# Patient Record
Sex: Female | Born: 1975
Health system: Southern US, Community
[De-identification: ages and names within clinical notes are randomized; demographics above are authoritative.]

## PROBLEM LIST (undated history)

## (undated) DIAGNOSIS — T7840XA Allergy, unspecified, initial encounter: Secondary | ICD-10-CM

## (undated) DIAGNOSIS — F329 Major depressive disorder, single episode, unspecified: Secondary | ICD-10-CM

## (undated) DIAGNOSIS — Z8619 Personal history of other infectious and parasitic diseases: Secondary | ICD-10-CM

## (undated) DIAGNOSIS — G43909 Migraine, unspecified, not intractable, without status migrainosus: Secondary | ICD-10-CM

## (undated) DIAGNOSIS — F32A Depression, unspecified: Secondary | ICD-10-CM

## (undated) HISTORY — DX: Personal history of other infectious and parasitic diseases: Z86.19

## (undated) HISTORY — DX: Migraine, unspecified, not intractable, without status migrainosus: G43.909

## (undated) HISTORY — PX: WISDOM TOOTH EXTRACTION: SHX21

## (undated) HISTORY — DX: Major depressive disorder, single episode, unspecified: F32.9

## (undated) HISTORY — DX: Depression, unspecified: F32.A

## (undated) HISTORY — DX: Allergy, unspecified, initial encounter: T78.40XA

---

## 2014-02-19 ENCOUNTER — Ambulatory Visit: Payer: Self-pay | Admitting: Family Medicine

## 2014-03-05 ENCOUNTER — Ambulatory Visit (INDEPENDENT_AMBULATORY_CARE_PROVIDER_SITE_OTHER): Payer: BC Managed Care – PPO | Admitting: Family Medicine

## 2014-03-05 ENCOUNTER — Encounter: Payer: Self-pay | Admitting: Family Medicine

## 2014-03-05 VITALS — BP 98/70 | HR 79 | Temp 98.6°F | Ht 65.25 in | Wt 171.3 lb

## 2014-03-05 DIAGNOSIS — F329 Major depressive disorder, single episode, unspecified: Secondary | ICD-10-CM

## 2014-03-05 DIAGNOSIS — J309 Allergic rhinitis, unspecified: Secondary | ICD-10-CM | POA: Insufficient documentation

## 2014-03-05 DIAGNOSIS — M25559 Pain in unspecified hip: Secondary | ICD-10-CM | POA: Insufficient documentation

## 2014-03-05 DIAGNOSIS — F32A Depression, unspecified: Secondary | ICD-10-CM

## 2014-03-05 DIAGNOSIS — M25551 Pain in right hip: Secondary | ICD-10-CM

## 2014-03-05 DIAGNOSIS — Z23 Encounter for immunization: Secondary | ICD-10-CM

## 2014-03-05 NOTE — Addendum Note (Signed)
Addended by: Johnella MoloneyFUNDERBURK, JO A on: 03/05/2014 12:14 PM   Modules accepted: Orders

## 2014-03-05 NOTE — Progress Notes (Signed)
HPI:  Jasmine Mueller is here to establish care.  Last PCP and physical: sees a gynecologist for the gyn physical.   Has the following chronic problems that require follow up and concerns today:  R hip pain: -occurred a few nights ago while lying on R hip -lasted a few minutes and then resolved, no pain since -denies nausea, vomiting, diarrhea, constipation, changes in bowels, weight loss  Seasonal allergies: -takes claritin intermittently -no hx of asthma or sob with this  Hx of Depression: -on and off -sees Dr. Jeraldine LootsKuar -medications: on wellbutrin and prozac, is getting counseling as well -denies: SI, thoughts of self harm, hx of hospitalization  Migraines: -takes ibuprofen for this -occur a few times per month for many years -started in highschool -FH of migraines in mother -unchanged  HM: -flu vaccine: opted to get this today -Tdap: 2013 per her report  ROS negative for unless reported above: fevers, unintentional weight loss, hearing or vision loss, chest pain, palpitations, struggling to breath, hemoptysis, melena, hematochezia, hematuria, falls, loc, si, thoughts of self harm  Past Medical History  Diagnosis Date  . Depression   . Allergy   . Migraines   . History of chicken pox     History reviewed. No pertinent past surgical history.  Family History  Problem Relation Age of Onset  . Migraines Mother   . Glaucoma Father   . Hypertension Maternal Grandmother     History   Social History  . Marital Status: Married    Spouse Name: N/A    Number of Children: N/A  . Years of Education: N/A   Social History Main Topics  . Smoking status: Former Games developermoker  . Smokeless tobacco: None     Comment: light, occ in the past  . Alcohol Use: 0.0 oz/week    0 Not specified per week     Comment: 1 drink a few times per month  . Drug Use: No  . Sexual Activity: Yes   Other Topics Concern  . 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  GENERAL: vitals reviewed and listed above, alert, oriented, appears well hydrated and in no acute distress  HEENT: atraumatic, conjunttiva clear, no obvious abnormalities on inspection of external nose and ears  NECK: no obvious masses on inspection  LUNGS: clear to auscultation bilaterally, no wheezes, rales or rhonchi, good air movement  CV: HRRR, no peripheral edema  ABD: BS+, soft, NTTP, no rebound or guarding  MS: moves all extremities without noticeable abnormality - normal ROM hip, neg compression test, neg FABER and FADIR, normal gait, no bony TTP, no TTP over troch bursa  PSYCH: pleasant and cooperative, no obvious depression or anxiety  ASSESSMENT AND PLAN:  Discussed  the following assessment and plan:  Hip pain, right -no findings on exam and symptom brief and completely resolved -observation and follow up if recurrent or other symptoms  Allergic rhinitis, unspecified allergic rhinitis type  Depression -managed by psych.  -We reviewed the PMH, PSH, FH, SH, Meds and Allergies. -We provided refills for any medications we will prescribe as needed. -We addressed current concerns per orders and patient instructions. -We have asked for records for pertinent exams, studies, vaccines and notes from previous providers. -We have advised patient to follow up per instructions below. -flu vaccine -offered  preventive visit and labs but she declined   -Patient advised to return or notify a doctor immediately if symptoms worsen or persist or new concerns arise.  Patient Instructions  BEFORE YOU LEAVE: -flu vaccine  Follow up as needed     Brayan Votaw, Dahlia ClientHANNAH R.

## 2014-03-05 NOTE — Patient Instructions (Addendum)
BEFORE YOU LEAVE: -flu vaccine  Follow up as needed

## 2014-03-05 NOTE — Progress Notes (Signed)
Pre visit review using our clinic review tool, if applicable. No additional management support is needed unless otherwise documented below in the visit note. 

## 2014-07-16 LAB — HM PAP SMEAR: HM Pap smear: NEGATIVE

## 2014-09-18 ENCOUNTER — Ambulatory Visit (INDEPENDENT_AMBULATORY_CARE_PROVIDER_SITE_OTHER): Payer: BLUE CROSS/BLUE SHIELD | Admitting: Family Medicine

## 2014-09-18 ENCOUNTER — Encounter: Payer: Self-pay | Admitting: Family Medicine

## 2014-09-18 VITALS — BP 102/80 | HR 84 | Temp 98.6°F | Ht 65.5 in | Wt 158.5 lb

## 2014-09-18 DIAGNOSIS — Z0000 Encounter for general adult medical examination without abnormal findings: Secondary | ICD-10-CM | POA: Diagnosis not present

## 2014-09-18 DIAGNOSIS — J309 Allergic rhinitis, unspecified: Secondary | ICD-10-CM

## 2014-09-18 DIAGNOSIS — F39 Unspecified mood [affective] disorder: Secondary | ICD-10-CM | POA: Insufficient documentation

## 2014-09-18 DIAGNOSIS — G43809 Other migraine, not intractable, without status migrainosus: Secondary | ICD-10-CM

## 2014-09-18 LAB — BASIC METABOLIC PANEL
BUN: 15 mg/dL (ref 6–23)
CO2: 25 mEq/L (ref 19–32)
Calcium: 9.5 mg/dL (ref 8.4–10.5)
Chloride: 101 mEq/L (ref 96–112)
Creatinine, Ser: 0.72 mg/dL (ref 0.40–1.20)
GFR: 96.73 mL/min (ref >60.00)
Glucose, Bld: 92 mg/dL (ref 70–99)
Potassium: 3.7 mEq/L (ref 3.5–5.1)
Sodium: 134 mEq/L — ABNORMAL LOW (ref 135–145)

## 2014-09-18 LAB — LIPID PANEL
Cholesterol: 202 mg/dL — ABNORMAL HIGH (ref 0–200)
HDL: 49.5 mg/dL (ref >39.00)
LDL Cholesterol: 119 mg/dL — ABNORMAL HIGH (ref 0–99)
NonHDL: 152.5
Total CHOL/HDL Ratio: 4
Triglycerides: 167 mg/dL — ABNORMAL HIGH (ref 0.0–149.0)
VLDL: 33.4 mg/dL (ref 0.0–40.0)

## 2014-09-18 NOTE — Progress Notes (Signed)
HPI:  Here for CPE:  -Concerns and/or follow up today:  Has form to complete for work  Seasonal allergies: -takes claritin intermittently -no hx of asthma or sob with this  Hx of Depression: -on and off -sees Dr. Jeraldine Loots -medications: on wellbutrin and prozac, is getting counseling as well -denies: SI, thoughts of self harm, hx of hospitalization  Migraines: -takes ibuprofen for this -occur a few times per month for many years -started in highschool -FH of migraines in mother -unchanged  -Diet: variety of foods, balance and well rounded  -Exercise: no regular exercise - a little elliptical 3 days per week  -Taking folic acid, vitamin D or calcium: no  -Diabetes and Dyslipidemia Screening: doing today - FASTING  -Hx of HTN: no  -Vaccines: UTD  -pap history: sees gyn  -FDLMP: normal, regular  -sexual activity: yes, female partner, no new partners  -wants STI testing (Hep C if born [birth year]): no  -FH breast, colon or ovarian ca: see FH Last mammogram: sees gyn Last colon cancer screening:  Breast Ca Risk Assessment: -sees gyn  -Alcohol, Tobacco, drug use: see social history  Review of Systems - no fevers, unintentional weight loss, vision loss, hearing loss, chest pain, sob, hemoptysis, melena, hematochezia, hematuria, genital discharge, changing or concerning skin lesions, bleeding, bruising, loc, thoughts of self harm or SI  Past Medical History  Diagnosis Date  . Depression   . Allergy   . Migraines   . History of chicken pox     No past surgical history on file.  Family History  Problem Relation Age of Onset  . Migraines Mother   . Glaucoma Father   . Hypertension Maternal Grandmother     History   Social History  . Marital Status: Married    Spouse Name: N/A  . Number of Children: N/A  . Years of Education: N/A   Social History Main Topics  . Smoking status: Former Games developer  . Smokeless tobacco: Not on file     Comment: light, occ in the  past  . Alcohol Use: 0.0 oz/week    0 Standard drinks or equivalent per week     Comment: 1 drink a few times per month  . Drug Use: No  . Sexual Activity: Yes   Other Topics Concern  . 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  buPROPion (WELLBUTRIN XL) 150 MG 24 hr tablet, Take 150 mg by mouth daily., Disp: , Rfl:  .  FLUoxetine (PROZAC) 20 MG tablet, Take 20 mg by mouth daily., Disp: , Rfl:  .  Prenatal Vit-Fe Fumarate-FA (PRENATAL VITAMIN PO), Take by mouth daily., Disp: , Rfl:   EXAM:  Filed Vitals:   09/18/14 1124  BP: 102/80  Pulse: 84  Temp: 98.6 F (37 C)    GENERAL: vitals reviewed and listed below, alert, oriented, appears well hydrated and in no acute distress  HEENT: head atraumatic, PERRLA, normal appearance of eyes, ears, nose and mouth. moist mucus membranes.  NECK: supple, no masses or lymphadenopathy  LUNGS: clear to auscultation bilaterally, no rales, rhonchi or wheeze  CV: HRRR, no peripheral edema or cyanosis, normal pedal pulses  BREAST: declined  ABDOMEN: bowel sounds normal, soft, non tender to palpation, no masses, no rebound or guarding  GU: declined  RECTAL: refused  SKIN: no rash or abnormal lesions  MS: normal gait, moves all extremities normally  NEURO: CN II-XII grossly intact, normal muscle strength and sensation to light touch on extremities  PSYCH: normal affect, pleasant and cooperative  ASSESSMENT AND PLAN:  Discussed the following assessment and plan:  1.Visit for preventive health examination - Lipid panel - Basic metabolic panel -Discussed and advised all [place] preventive services health task force level A and B recommendations for age, sex and risks. -Advised at least 150 minutes of exercise per week  and a healthy diet low in saturated fats and sweets and consisting of fresh fruits and vegetables, lean meats such as fish and white chicken and whole grains. -does gyn and breast health with her gynecologist -FASTING labs, studies and vaccines per orders this encounter -form given to assistant to complete and fax once lab results return  2. Allergic rhinitis, unspecified allergic rhinitis type -stable, cont current tx  3. Depression -cont treatment with Dr. Evelene Croon  4. Other migraine without status migrainosus, not intractable -stable     Orders Placed This Encounter  Procedures  . Lipid panel  . Basic metabolic panel    Patient advised to return to clinic immediately if symptoms worsen or persist or new concerns.  Patient Instructions    -Vit D3 [phone] IU daily and folic acid if chance of pregnancy    No Follow-up on file.  Kriste Basque R.

## 2014-09-18 NOTE — Patient Instructions (Addendum)
BEFORE YOU LEAVE: -labs -check waist circumference -schedule follow up in 1 year for a physical exam  -We have ordered labs or studies at this visit. It can take up to 1-2 weeks for results and processing. We will contact you with instructions IF your results are abnormal. Normal results will be released to your [facility]. If you have not heard from [place] or can not find your results in [facility] in 2 weeks please contact our office.  -Vit D3 [phone] IU daily and folic acid if chance of pregnancy  We recommend the following healthy lifestyle measures: - eat a healthy diet consisting of lots of vegetables, fruits, beans, nuts, seeds, healthy meats such as white chicken and fish and whole grains.  - avoid fried foods, fast food, processed foods, sodas, red meet and other fattening foods.  - get a least 150 minutes of aerobic exercise per week.

## 2014-09-18 NOTE — Progress Notes (Signed)
Pre visit review using our clinic review tool, if applicable. No additional management support is needed unless otherwise documented below in the visit note. 

## 2016-02-01 ENCOUNTER — Ambulatory Visit (INDEPENDENT_AMBULATORY_CARE_PROVIDER_SITE_OTHER): Payer: BLUE CROSS/BLUE SHIELD | Admitting: Family Medicine

## 2016-02-01 ENCOUNTER — Encounter: Payer: Self-pay | Admitting: Family Medicine

## 2016-02-01 VITALS — BP 90/68 | HR 76 | Temp 98.2°F | Ht 65.75 in | Wt 165.8 lb

## 2016-02-01 DIAGNOSIS — E785 Hyperlipidemia, unspecified: Secondary | ICD-10-CM

## 2016-02-01 DIAGNOSIS — R739 Hyperglycemia, unspecified: Secondary | ICD-10-CM

## 2016-02-01 DIAGNOSIS — Z6826 Body mass index (BMI) 26.0-26.9, adult: Secondary | ICD-10-CM

## 2016-02-01 DIAGNOSIS — Z23 Encounter for immunization: Secondary | ICD-10-CM

## 2016-02-01 DIAGNOSIS — Z0000 Encounter for general adult medical examination without abnormal findings: Secondary | ICD-10-CM | POA: Diagnosis not present

## 2016-02-01 LAB — LIPID PANEL
Cholesterol: 225 mg/dL — ABNORMAL HIGH (ref 0–200)
HDL: 57.2 mg/dL (ref >39.00)
LDL Cholesterol: 132 mg/dL — ABNORMAL HIGH (ref 0–99)
NonHDL: 167.63
Total CHOL/HDL Ratio: 4
Triglycerides: 176 mg/dL — ABNORMAL HIGH (ref 0.0–149.0)
VLDL: 35.2 mg/dL (ref 0.0–40.0)

## 2016-02-01 LAB — BASIC METABOLIC PANEL
BUN: 13 mg/dL (ref 6–23)
CO2: 26 mEq/L (ref 19–32)
Calcium: 9.3 mg/dL (ref 8.4–10.5)
Chloride: 101 mEq/L (ref 96–112)
Creatinine, Ser: 0.67 mg/dL (ref 0.40–1.20)
GFR: 104.34 mL/min (ref >60.00)
Glucose, Bld: 108 mg/dL — ABNORMAL HIGH (ref 70–99)
Potassium: 3.6 mEq/L (ref 3.5–5.1)
Sodium: 137 mEq/L (ref 135–145)

## 2016-02-01 NOTE — Progress Notes (Signed)
HPI:  Here for CPE:  -Concerns and/or follow up today: Requests that I am metrics screening form from work because completed. Does need a fasting blood glucose and lipid panel for completion of this form.  Seasonal allergies: -takes claritin intermittently -no hx of asthma or sob with this  Hx of Depression: -on and off -sees Dr. Jeraldine LootsKuar -medications: No longer on any medications and is doing well - continues to follow with pcych -denies: SI, thoughts of self harm, hx of hospitalization  Migraines: -Stable -takes ibuprofen for this -occur a few times per month for many years -started in highschool -FH of migraines in mother -unchanged  -Diet: variety of foods, balance and well rounded, larger portion sizes  -Exercise: no regular exercise  -Taking folic acid, vitamin D or calcium: no  -Diabetes and Dyslipidemia Screening: FASTING for labs  -Hx of HTN: no  -Vaccines: UTD except for flu shot which she plans to do today  -pap history: sees Dr. Mora ApplPinn, reports pap exam yearly   -wants STI testing (Hep C if born [birth year]): no  Breast Ca Risk Assessment: -Sees Dr. Mora ApplPinn for breast health and assessment  -Alcohol, Tobacco, drug use: see social history  Review of Systems - no fevers, unintentional weight loss, vision loss, hearing loss, chest pain, sob, hemoptysis, melena, hematochezia, hematuria, genital discharge, changing or concerning skin lesions, bleeding, bruising, loc, thoughts of self harm or SI  Past Medical History:  Diagnosis Date  . Allergy   . Depression   . History of chicken pox   . Migraines     No past surgical history on file.  Family History  Problem Relation Age of Onset  . Migraines Mother   . Glaucoma Father   . Hypertension Maternal Grandmother     Social History   Social History  . Marital status: Married    Spouse name: N/A  . Number of children: N/A  . Years of education: N/A   Social History Main Topics  . Smoking status: Former  Games developermoker  . Smokeless tobacco: None     Comment: light, occ in the past  . Alcohol use 0.0 oz/week     Comment: 1 drink a few times per month  . Drug use: No  . Sexual activity: Yes   Other Topics Concern  . 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  EXAM:  Vitals:   02/01/16 1137  BP: 90/68  Pulse: 76  Temp: 98.2 F (36.8 C)    GENERAL: vitals reviewed and listed below, alert, oriented, appears well hydrated and in no acute distress  HEENT: head atraumatic, PERRLA, normal appearance of eyes, ears, nose and mouth. moist mucus membranes.  NECK: supple, no masses or lymphadenopathy  LUNGS: clear to auscultation bilaterally, no rales, rhonchi or wheeze  CV: HRRR, no peripheral edema or cyanosis, normal pedal pulses  BREAST: declined, does with gyn  ABDOMEN: bowel sounds normal, soft, non tender to palpation, no masses, no rebound or guarding  GU: declined, does with gyn  SKIN: no rash or abnormal lesions  MS: normal gait, moves all extremities normally  NEURO: normal gait, speech and thought processing grossly intact, muscle tone grossly  intact throughout  PSYCH: normal affect, pleasant and cooperative  ASSESSMENT AND PLAN:  Discussed the following assessment and plan:  Encounter for preventive health examination - Plan: Lipid Panel, Basic metabolic panel  BMI 26.0-26.9,adult  Encounter for immunization - Plan: Flu Vaccine QUAD 36+ mos IM  -Discussed and advised all [place] preventive services health task force level A and B recommendations for age, sex and risks. -Advised at least 150 minutes of exercise per week and a healthy diet  -FASTING labs, studies and vaccines per orders this encounter  Orders Placed This Encounter  Procedures  .  Flu Vaccine QUAD 36+ mos IM  . Lipid Panel  . Basic metabolic panel    Patient advised to return to clinic immediately if symptoms worsen or persist or new concerns.  Patient Instructions  BEFORE YOU LEAVE: -flu shot -pap report from Dr. Mora ApplPinn -follow up: yearly and as needed -labs  Vit D3 [phone] IU daily  We have ordered labs or studies at this visit. It can take up to 1-2 weeks for results and processing. IF results require follow up or explanation, we will call you with instructions. Clinically stable results will be released to your [facility]. If you have not heard from us or cannot find your results in [facility] in 2 weeks please contact our office at [phone].  If you are not yet signed up for [facility], please consider signing up.  We recommend the following healthy lifestyle for LIFE: 1) Small portions.   Tip: eat off of a salad plate instead of a dinner plate.  Tip: if you need more or a snack choose fruits, veggies and/or a handful of nuts or seeds.  2) Eat a healthy clean diet.  * Tip: Avoid (less then 1 serving per week): processed foods, sweets, sweetened drinks, white starches (rice, flour, bread, potatoes, pasta, etc), red meat, fast foods, butter  *Tip: CHOOSE instead   * 5-9 servings per day of fresh or frozen fruits and vegetables (but not corn, potatoes, bananas, canned or dried fruit)   *nuts and seeds, beans   *olives and olive oil   *small portions of lean meats such as fish and white chicken    *small portions of whole grains  3)Get at least 150 minutes of sweaty aerobic exercise per week.  4)Reduce stress - consider counseling, meditation and relaxation to balance other aspects of your life.     No Follow-up on file.  Kriste BasqueKIM, HANNAH R., DO

## 2016-02-01 NOTE — Progress Notes (Signed)
Pre visit review using our clinic review tool, if applicable. No additional management support is needed unless otherwise documented below in the visit note. 

## 2016-02-01 NOTE — Patient Instructions (Signed)
BEFORE YOU LEAVE: -flu shot -pap report from Dr. Mora ApplPinn -follow up: yearly and as needed -labs  Vit D3 [phone] IU daily  We have ordered labs or studies at this visit. It can take up to 1-2 weeks for results and processing. IF results require follow up or explanation, we will call you with instructions. Clinically stable results will be released to your [facility]. If you have not heard from us or cannot find your results in [facility] in 2 weeks please contact our office at [phone].  If you are not yet signed up for [facility], please consider signing up.  We recommend the following healthy lifestyle for LIFE: 1) Small portions.   Tip: eat off of a salad plate instead of a dinner plate.  Tip: if you need more or a snack choose fruits, veggies and/or a handful of nuts or seeds.  2) Eat a healthy clean diet.  * Tip: Avoid (less then 1 serving per week): processed foods, sweets, sweetened drinks, white starches (rice, flour, bread, potatoes, pasta, etc), red meat, fast foods, butter  *Tip: CHOOSE instead   * 5-9 servings per day of fresh or frozen fruits and vegetables (but not corn, potatoes, bananas, canned or dried fruit)   *nuts and seeds, beans   *olives and olive oil   *small portions of lean meats such as fish and white chicken    *small portions of whole grains  3)Get at least 150 minutes of sweaty aerobic exercise per week.  4)Reduce stress - consider counseling, meditation and relaxation to balance other aspects of your life.

## 2016-02-03 ENCOUNTER — Encounter: Payer: Self-pay | Admitting: Family Medicine

## 2016-02-10 NOTE — Addendum Note (Signed)
Addended by: Johnella MoloneyFUNDERBURK, JO A on: 02/10/2016 10:18 AM   Modules accepted: Orders

## 2016-08-14 ENCOUNTER — Other Ambulatory Visit: Payer: BLUE CROSS/BLUE SHIELD

## 2016-08-15 NOTE — Progress Notes (Deleted)
  HPI:  Jasmine Mueller is a pleasant 41 y.o. here for follow up. Chronic medical problems summarized below were reviewed for changes and stability and were updated as needed below. These issues and their treatment remain stable for the most part. ***. Denies CP, SOB, DOE, treatment intolerance or new symptoms.  Hyperlipidemia/Hyperglycemia: -on labs last visit, lifestlye recs advised   Seasonal allergies: -takes claritin intermittently -no hx of asthma or sob with this  Hx of Depression: -on and off -sees Dr. Jeraldine LootsKuar -medications: No longer on any medications and is doing well - continues to follow with pcych -denies: SI, thoughts of self harm, hx of hospitalization  Migraines: -Stable -takes ibuprofen for this -occur a few times per month for many years -started in highschool -FH of migraines in mother -unchanged   ROS: See pertinent positives and negatives per HPI.  Past Medical History:  Diagnosis Date  . Allergy   . Depression   . History of chicken pox   . Migraines     No past surgical history on file.  Family History  Problem Relation Age of Onset  . Migraines Mother   . Glaucoma Father   . Hypertension Maternal Grandmother     Social History   Social History  . Marital status: Married    Spouse name: N/A  . Number of children: N/A  . Years of education: N/A   Social History Main Topics  . Smoking status: Former Games developermoker  . Smokeless tobacco: Not on file     Comment: light, occ in the past  . Alcohol use 0.0 oz/week     Comment: 1 drink a few times per month  . Drug use: No  . Sexual activity: Yes   Other Topics Concern  . 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  EXAM:  There were no vitals filed for this  visit.  There is no height or weight on file to calculate BMI.  GENERAL: vitals reviewed and listed above, alert, oriented, appears well hydrated and in no acute distress  HEENT: atraumatic, conjunttiva clear, no obvious abnormalities on inspection of external nose and ears  NECK: no obvious masses on inspection  LUNGS: clear to auscultation bilaterally, no wheezes, rales or rhonchi, good air movement  CV: HRRR, no peripheral edema  MS: moves all extremities without noticeable abnormality  PSYCH: pleasant and cooperative, no obvious depression or anxiety  ASSESSMENT AND PLAN:  Discussed the following assessment and plan:  No diagnosis found.  -Patient advised to return or notify a doctor immediately if symptoms worsen or persist or new concerns arise.  There are no Patient Instructions on file for this visit.  Kriste BasqueKIM, HANNAH R., DO

## 2016-08-17 ENCOUNTER — Ambulatory Visit: Payer: BLUE CROSS/BLUE SHIELD | Admitting: Family Medicine

## 2016-08-17 ENCOUNTER — Telehealth: Payer: Self-pay | Admitting: *Deleted

## 2016-08-17 DIAGNOSIS — Z0289 Encounter for other administrative examinations: Secondary | ICD-10-CM

## 2016-08-17 NOTE — Telephone Encounter (Signed)
Patient was a no-show for today's appt.  I called the pt to check on her and she stated she thought she hit a button to cancel the appt when she received a reminder call.  Stated she will call back for a follow up visit.

## 2016-12-21 ENCOUNTER — Encounter: Payer: Self-pay | Admitting: Family Medicine

## 2017-02-02 ENCOUNTER — Encounter: Payer: BLUE CROSS/BLUE SHIELD | Admitting: Family Medicine

## 2017-02-25 NOTE — Progress Notes (Signed)
HPI:  Here for CPE: Due for flu shot and labs.   -Concerns and/or follow up today:  Hx of hyperlipidemia, hyperglycemia, depression, migraines and seasonal allergies. She was advised to make lifestyle changes for hyperglycemia and hyperlipidemia and follow up for recheck after last CPE. She did not return until now.  Reports she is doing well.  She is working with a nutritionist and reports she has been eating healthier and getting regular exercise.  She has a form from work for completion, a biometric screening form that requires fasting lab work.  Used to see Dr. Toy Care in pscyhiatry. Off medications and doing well last year.  -Diet: variety of foods, balance and well rounded, larger portion sizes -Exercise: no regular exercise -Taking folic acid, vitamin D or calcium: no -Diabetes and Dyslipidemia Screening: Due for labs -Vaccines: see vaccine section EPIC -pap history: saw Dr. Alwyn Pea in gyn in the past, last pap 07/2014 in epic, reports HAVE been normal and she would like to do her next Pap next year with [place] if possible -FDLMP: see nursing notes -sexual activity: yes, female partner, no new partners -wants STI testing (Hep C if born 90-65): no -FH breast, colon or ovarian ca: see FH Last mammogram: n/a Last colon cancer screening: n/a Breast Ca Risk Assessment: see family history and pt history DEXA (>/= 65): Not applicable  -Alcohol, Tobacco, drug use: see social history  Review of Systems - no fevers, unintentional weight loss, vision loss, hearing loss, chest pain, sob, hemoptysis, melena, hematochezia, hematuria, genital discharge, changing or concerning skin lesions, bleeding, bruising, loc, thoughts of self harm or SI  Past Medical History:  Diagnosis Date  . Allergy   . Depression   . History of chicken pox   . Migraines     History reviewed. No pertinent surgical history.  Family History  Problem Relation Age of Onset  . Migraines Mother   . Glaucoma Father   .  Hypertension Maternal Grandmother     Social History   Socioeconomic History  . Marital status: Married    Spouse name: None  . Number of children: None  . Years of education: None  . Highest education level: None  Social Needs  . Financial resource strain: None  . Food insecurity - worry: None  . Food insecurity - inability: None  . Transportation needs - medical: None  . Transportation needs - non-medical: None  Occupational History  . None  Tobacco Use  . Smoking status: Former Research scientist (life sciences)  . Smokeless tobacco: Never Used  . Tobacco comment: light, occ in the past  Substance and Sexual Activity  . Alcohol use: Yes    Alcohol/week: 0.0 oz    Comment: 1 drink a few times per month  . Drug use: No  . Sexual activity: Yes  Other Topics Concern  . 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  EXAM:  Vitals:   02/26/17 1313  BP: 102/80  Pulse: 81  Temp: 99.1 F (37.3 C)   Body mass index is 29.58 kg/m. Visit diagnosis gENERAL: vitals reviewed and listed below, alert, oriented, appears well hydrated and in no acute distress  HEENT: head atraumatic, PERRLA, normal appearance of  eyes, ears, nose and mouth. moist mucus membranes.  NECK: supple, no masses or lymphadenopathy  LUNGS: clear to auscultation bilaterally, no rales, rhonchi or wheeze  CV: HRRR, no peripheral edema or cyanosis, normal pedal pulses  ABDOMEN: bowel sounds normal, soft, non tender to palpation, no masses, no rebound or guarding  GU/BREAST: Declined, she agrees to self breast exams and to do a GYN visit here next year when she needs her Pap smear t  SKIN: no rash or abnormal lesions  MS: normal gait, moves all extremities normally  NEURO: normal gait, speech and thought processing  grossly intact, muscle tone grossly intact throughout  PSYCH: normal affect, pleasant and cooperative  ASSESSMENT AND PLAN:  Discussed the following assessment and plan:  PREVENTIVE EXAM: -Discussed and advised all [place] preventive services health task force level A and B recommendations for age, sex and risks. -Advised at least 150 minutes of exercise per week and a healthy diet with avoidance of (less then 1 serving per week) processed foods, white starches, red meat, fast foods and sweets and consisting of: * 5-9 servings of fresh fruits and vegetables (not corn or potatoes) *nuts and seeds, beans *olives and olive oil *lean meats such as fish and white chicken  *whole grains -labs, studies and vaccines per orders this encounter     Patient advised to return to clinic immediately if symptoms worsen or persist or new concerns.  Patient Instructions  BEFORE YOU LEAVE: -Flu shot  -Schedule fasting lab visit in the next 1-2 weeks -follow up: cpe w/ pap in 1 year  We have ordered labs or studies at this visit. It can take up to 1-2 weeks for results and processing. IF results require follow up or explanation, we will call you with instructions. Clinically stable results will be released to your [facility]. If you have not heard from [place] or cannot find your results in [facility] in 2 weeks please contact our office at [phone].  If you are not yet signed up for [facility], please consider signing up.  We recommend the following healthy lifestyle for LIFE: 1) Small portions. But, make sure to get regular (at least 3 per day), healthy meals and small healthy snacks if needed.  2) Eat a healthy clean diet.   TRY TO EAT: -at least 5-7 servings of low sugar, colorful, and nutrient rich vegetables per day (not corn, potatoes or bananas.) -berries are the best choice if you wish to eat fruit (only eat small amounts if trying to reduce weight)  -lean meets (fish, white meat of chicken or  [place]) -vegan proteins for some meals - beans or tofu, whole grains, nuts and seeds -Replace bad fats with good fats - good fats include: fish, nuts and seeds, canola oil, olive oil -small amounts of low fat or non fat dairy -small amounts of100 % whole grains - check the lables -drink plenty of water  AVOID: -SUGAR, sweets, anything with added sugar, corn syrup or sweeteners - must read labels as even foods advertised as "healthy" often are loaded with sugar -if you must have a sweetener, small amounts of stevia may be best -sweetened beverages and artificially sweetened beverages -simple starches (rice, bread, potatoes, pasta, chips, etc - small amounts of 100% whole grains are ok) -red meat, pork, butter -fried foods, fast food, processed food, excessive dairy, eggs and coconut.  3)Get at least 150 minutes of sweaty aerobic exercise per week.  4)Reduce stress - consider counseling, meditation and relaxation to balance other  aspects of your life.  Health Maintenance, Female Adopting a healthy lifestyle and getting preventive care can go a long way to promote health and wellness. Talk with your health care provider about what schedule of regular examinations is right for you. This is a good chance for you to check in with your provider about disease prevention and staying healthy. In between checkups, there are plenty of things you can do on your own. Experts have done a lot of research about which lifestyle changes and preventive measures are most likely to keep you healthy. Ask your health care provider for more information. Weight and diet Eat a healthy diet  Be sure to include plenty of vegetables, fruits, low-fat dairy products, and lean protein.  Do not eat a lot of foods high in solid fats, added sugars, or salt.  Get regular exercise. This is one of the most important things you can do for your health. ? Most adults should exercise for at least 150 minutes each week. The  exercise should increase your heart rate and make you sweat (moderate-intensity exercise). ? Most adults should also do strengthening exercises at least twice a week. This is in addition to the moderate-intensity exercise.  Maintain a healthy weight  Body mass index (BMI) is a measurement that can be used to identify possible weight problems. It estimates body fat based on height and weight. Your health care provider can help determine your BMI and help you achieve or maintain a healthy weight.  For females 58 years of age and older: ? A BMI below 18.5 is considered underweight. ? A BMI of 18.5 to 24.9 is normal. ? A BMI of 25 to 29.9 is considered overweight. ? A BMI of 30 and above is considered obese.  Watch levels of cholesterol and blood lipids  You should start having your blood tested for lipids and cholesterol at 39 years of age, then have this test every 5 years.  You may need to have your cholesterol levels checked more often if: ? Your lipid or cholesterol levels are high. ? You are older than 39 years of age. ? You are at high risk for heart disease.  Cancer screening Lung Cancer  Lung cancer screening is recommended for adults 44-25 years old who are at high risk for lung cancer because of a history of smoking.  A yearly low-dose CT scan of the lungs is recommended for people who: ? Currently smoke. ? Have quit within the past 15 years. ? Have at least a 30-pack-year history of smoking. A pack year is smoking an average of one pack of cigarettes a day for 1 year.  Yearly screening should continue until it has been 15 years since you quit.  Yearly screening should stop if you develop a health problem that would prevent you from having lung cancer treatment.  Breast Cancer  Practice breast self-awareness. This means understanding how your breasts normally appear and feel.  It also means doing regular breast self-exams. Let your health care provider know about any  changes, no matter how small.  If you are in your 20s or 30s, you should have a clinical breast exam (CBE) by a health care provider every 1-3 years as part of a regular health exam.  If you are 58 or older, have a CBE every year. Also consider having a breast X-ray (mammogram) every year.  If you have a family history of breast cancer, talk to your health care provider about genetic screening.  If you are at high risk for breast cancer, talk to your health care provider about having an MRI and a mammogram every year.  Breast cancer gene (BRCA) assessment is recommended for women who have family members with BRCA-related cancers. BRCA-related cancers include: ? Breast. ? Ovarian. ? Tubal. ? Peritoneal cancers.  Results of the assessment will determine the need for genetic counseling and BRCA1 and BRCA2 testing.  Cervical Cancer Your health care provider may recommend that you be screened regularly for cancer of the pelvic organs (ovaries, uterus, and vagina). This screening involves a pelvic examination, including checking for microscopic changes to the surface of your cervix (Pap test). You may be encouraged to have this screening done every 3 years, beginning at age 37.  For women ages 84-65, health care providers may recommend pelvic exams and Pap testing every 3 years, or they may recommend the Pap and pelvic exam, combined with testing for human papilloma virus (HPV), every 5 years. Some types of HPV increase your risk of cervical cancer. Testing for HPV may also be done on women of any age with unclear Pap test results.  Other health care providers may not recommend any screening for nonpregnant women who are considered low risk for pelvic cancer and who do not have symptoms. Ask your health care provider if a screening pelvic exam is right for you.  If you have had past treatment for cervical cancer or a condition that could lead to cancer, you need Pap tests and screening for cancer  for at least 20 years after your treatment. If Pap tests have been discontinued, your risk factors (such as having a new sexual partner) need to be reassessed to determine if screening should resume. Some women have medical problems that increase the chance of getting cervical cancer. In these cases, your health care provider may recommend more frequent screening and Pap tests.  Colorectal Cancer  This type of cancer can be detected and often prevented.  Routine colorectal cancer screening usually begins at 39 years of age and continues through 39 years of age.  Your health care provider may recommend screening at an earlier age if you have risk factors for colon cancer.  Your health care provider may also recommend using home test kits to check for hidden blood in the stool.  A small camera at the end of a tube can be used to examine your colon directly (sigmoidoscopy or colonoscopy). This is done to check for the earliest forms of colorectal cancer.  Routine screening usually begins at age 66.  Direct examination of the colon should be repeated every 5-10 years through 39 years of age. However, you may need to be screened more often if early forms of precancerous polyps or small growths are found.  Skin Cancer  Check your skin from head to toe regularly.  Tell your health care provider about any new moles or changes in moles, especially if there is a change in a mole's shape or color.  Also tell your health care provider if you have a mole that is larger than the size of a pencil eraser.  Always use sunscreen. Apply sunscreen liberally and repeatedly throughout the day.  Protect yourself by wearing long sleeves, pants, a wide-brimmed hat, and sunglasses whenever you are outside.  Heart disease, diabetes, and high blood pressure  High blood pressure causes heart disease and increases the risk of stroke. High blood pressure is more likely to develop in: ? People who have blood  pressure in the high end of the normal range (130-139/85-89 mm Hg). ? People who are overweight or obese. ? People who are African American.  If you are 75-36 years of age, have your blood pressure checked every 3-5 years. If you are 25 years of age or older, have your blood pressure checked every year. You should have your blood pressure measured twice-once when you are at a hospital or clinic, and once when you are not at a hospital or clinic. Record the average of the two measurements. To check your blood pressure when you are not at a hospital or clinic, you can use: ? An automated blood pressure machine at a pharmacy. ? A home blood pressure monitor.  If you are between 56 years and 54 years old, ask your health care provider if you should take aspirin to prevent strokes.  Have regular diabetes screenings. This involves taking a blood sample to check your fasting blood sugar level. ? If you are at a normal weight and have a low risk for diabetes, have this test once every three years after 39 years of age. ? If you are overweight and have a high risk for diabetes, consider being tested at a younger age or more often. Preventing infection Hepatitis B  If you have a higher risk for hepatitis B, you should be screened for this virus. You are considered at high risk for hepatitis B if: ? You were born in a country where hepatitis B is common. Ask your health care provider which countries are considered high risk. ? Your parents were born in a high-risk country, and you have not been immunized against hepatitis B (hepatitis B vaccine). ? You have HIV or AIDS. ? You use needles to inject street drugs. ? You live with someone who has hepatitis B. ? You have had sex with someone who has hepatitis B. ? You get hemodialysis treatment. ? You take certain medicines for conditions, including cancer, organ transplantation, and autoimmune conditions.  Hepatitis C  Blood testing is recommended  for: ? Everyone born from [birth year] through 1963. ? Anyone with known risk factors for hepatitis C.  Sexually transmitted infections (STIs)  You should be screened for sexually transmitted infections (STIs) including gonorrhea and chlamydia if: ? You are sexually active and are younger than 39 years of age. ? You are older than 39 years of age and your health care provider tells you that you are at risk for this type of infection. ? Your sexual activity has changed since you were last screened and you are at an increased risk for chlamydia or gonorrhea. Ask your health care provider if you are at risk.  If you do not have HIV, but are at risk, it may be recommended that you take a prescription medicine daily to prevent HIV infection. This is called pre-exposure prophylaxis (PrEP). You are considered at risk if: ? You are sexually active and do not regularly use condoms or know the HIV status of your partner(s). ? You take drugs by injection. ? You are sexually active with a partner who has HIV.  Talk with your health care provider about whether you are at high risk of being infected with HIV. If you choose to begin PrEP, you should first be tested for HIV. You should then be tested every 3 months for as long as you are taking PrEP. Pregnancy  If you are premenopausal and you may become pregnant, ask your health care provider about preconception counseling.  If you may become pregnant, take 400 to 800 micrograms (mcg) of folic acid every day.  If you want to prevent pregnancy, talk to your health care provider about birth control (contraception). Osteoporosis and menopause  Osteoporosis is a disease in which the bones lose minerals and strength with aging. This can result in serious bone fractures. Your risk for osteoporosis can be identified using a bone density scan.  If you are 31 years of age or older, or if you are at risk for osteoporosis and fractures, ask your health care provider if  you should be screened.  Ask your health care provider whether you should take a calcium or vitamin D supplement to lower your risk for osteoporosis.  Menopause may have certain physical symptoms and risks.  Hormone replacement therapy may reduce some of these symptoms and risks. Talk to your health care provider about whether hormone replacement therapy is right for you. Follow these instructions at home:  Schedule regular health, dental, and eye exams.  Stay current with your immunizations.  Do not use any tobacco products including cigarettes, chewing tobacco, or electronic cigarettes.  If you are pregnant, do not drink alcohol.  If you are breastfeeding, limit how much and how often you drink alcohol.  Limit alcohol intake to no more than 1 drink per day for nonpregnant women. One drink equals 12 ounces of beer, 5 ounces of wine, or 1 ounces of hard liquor.  Do not use street drugs.  Do not share needles.  Ask your health care provider for help if you need support or information about quitting drugs.  Tell your health care provider if you often feel depressed.  Tell your health care provider if you have ever been abused or do not feel safe at home. This information is not intended to replace advice given to you by your health care provider. Make sure you discuss any questions you have with your health care provider. Document Released: 10/03/2010 Document Revised: 08/26/2015 Document Reviewed: 12/22/2014 Elsevier Interactive Patient Education  2018 [employer].    No Follow-up on file.  Colin Benton R., DO

## 2017-02-26 ENCOUNTER — Ambulatory Visit (INDEPENDENT_AMBULATORY_CARE_PROVIDER_SITE_OTHER): Payer: BLUE CROSS/BLUE SHIELD | Admitting: Family Medicine

## 2017-02-26 ENCOUNTER — Encounter: Payer: Self-pay | Admitting: Family Medicine

## 2017-02-26 VITALS — BP 102/80 | HR 81 | Temp 99.1°F | Ht 65.25 in | Wt 179.1 lb

## 2017-02-26 DIAGNOSIS — Z0000 Encounter for general adult medical examination without abnormal findings: Secondary | ICD-10-CM

## 2017-02-26 DIAGNOSIS — R739 Hyperglycemia, unspecified: Secondary | ICD-10-CM

## 2017-02-26 DIAGNOSIS — Z6829 Body mass index (BMI) 29.0-29.9, adult: Secondary | ICD-10-CM

## 2017-02-26 DIAGNOSIS — E785 Hyperlipidemia, unspecified: Secondary | ICD-10-CM

## 2017-02-26 DIAGNOSIS — Z23 Encounter for immunization: Secondary | ICD-10-CM | POA: Diagnosis not present

## 2017-02-26 NOTE — Patient Instructions (Signed)
BEFORE YOU LEAVE: -Flu shot  -Schedule fasting lab visit in the next 1-2 weeks -follow up: cpe w/ pap in 1 year  We have ordered labs or studies at this visit. It can take up to 1-2 weeks for results and processing. IF results require follow up or explanation, we will call you with instructions. Clinically stable results will be released to your [facility]. If you have not heard from [place] or cannot find your results in [facility] in 2 weeks please contact our office at [phone].  If you are not yet signed up for [facility] please consider signing up.  We recommend the following healthy lifestyle for LIFE: 1) Small portions. But, make sure to get regular (at least 3 per day), healthy meals and small healthy snacks if needed.  2) Eat a healthy clean diet.   TRY TO EAT: -at least 5-7 servings of low sugar, colorful, and nutrient rich vegetables per day (not corn, potatoes or bananas.) -berries are the best choice if you wish to eat fruit (only eat small amounts if trying to reduce weight)  -lean meets (fish, white meat of chicken or [place]) -vegan proteins for some meals - beans or tofu, whole grains, nuts and seeds -Replace bad fats with good fats - good fats include: fish, nuts and seeds, canola oil, olive oil -small amounts of low fat or non fat dairy -small amounts of100 % whole grains - check the lables -drink plenty of water  AVOID: -SUGAR, sweets, anything with added sugar, corn syrup or sweeteners - must read labels as even foods advertised as "healthy" often are loaded with sugar -if you must have a sweetener, small amounts of stevia may be best -sweetened beverages and artificially sweetened beverages -simple starches (rice, bread, potatoes, pasta, chips, etc - small amounts of 100% whole grains are ok) -red meat, pork, butter -fried foods, fast food, processed food, excessive dairy, eggs and coconut.  3)Get at least 150 minutes of sweaty aerobic exercise per week.  4)Reduce stress  - consider counseling, meditation and relaxation to balance other aspects of your life.  Health Maintenance, Female Adopting a healthy lifestyle and getting preventive care can go a long way to promote health and wellness. Talk with your health care provider about what schedule of regular examinations is right for you. This is a good chance for you to check in with your provider about disease prevention and staying healthy. In between checkups, there are plenty of things you can do on your own. Experts have done a lot of research about which lifestyle changes and preventive measures are most likely to keep you healthy. Ask your health care provider for more information. Weight and diet Eat a healthy diet  Be sure to include plenty of vegetables, fruits, low-fat dairy products, and lean protein.  Do not eat a lot of foods high in solid fats, added sugars, or salt.  Get regular exercise. This is one of the most important things you can do for your health. ? Most adults should exercise for at least 150 minutes each week. The exercise should increase your heart rate and make you sweat (moderate-intensity exercise). ? Most adults should also do strengthening exercises at least twice a week. This is in addition to the moderate-intensity exercise.  Maintain a healthy weight  Body mass index (BMI) is a measurement that can be used to identify possible weight problems. It estimates body fat based on height and weight. Your health care provider can help determine your BMI and  help you achieve or maintain a healthy weight.  For females 79 years of age and older: ? A BMI below 18.5 is considered underweight. ? A BMI of 18.5 to 24.9 is normal. ? A BMI of 25 to 29.9 is considered overweight. ? A BMI of 30 and above is considered obese.  Watch levels of cholesterol and blood lipids  You should start having your blood tested for lipids and cholesterol at 39 years of age, then have this test every 5  years.  You may need to have your cholesterol levels checked more often if: ? Your lipid or cholesterol levels are high. ? You are older than 39 years of age. ? You are at high risk for heart disease.  Cancer screening Lung Cancer  Lung cancer screening is recommended for adults 61-67 years old who are at high risk for lung cancer because of a history of smoking.  A yearly low-dose CT scan of the lungs is recommended for people who: ? Currently smoke. ? Have quit within the past 15 years. ? Have at least a 30-pack-year history of smoking. A pack year is smoking an average of one pack of cigarettes a day for 1 year.  Yearly screening should continue until it has been 15 years since you quit.  Yearly screening should stop if you develop a health problem that would prevent you from having lung cancer treatment.  Breast Cancer  Practice breast self-awareness. This means understanding how your breasts normally appear and feel.  It also means doing regular breast self-exams. Let your health care provider know about any changes, no matter how small.  If you are in your 20s or 30s, you should have a clinical breast exam (CBE) by a health care provider every 1-3 years as part of a regular health exam.  If you are 109 or older, have a CBE every year. Also consider having a breast X-ray (mammogram) every year.  If you have a family history of breast cancer, talk to your health care provider about genetic screening.  If you are at high risk for breast cancer, talk to your health care provider about having an MRI and a mammogram every year.  Breast cancer gene (BRCA) assessment is recommended for women who have family members with BRCA-related cancers. BRCA-related cancers include: ? Breast. ? Ovarian. ? Tubal. ? Peritoneal cancers.  Results of the assessment will determine the need for genetic counseling and BRCA1 and BRCA2 testing.  Cervical Cancer Your health care provider may  recommend that you be screened regularly for cancer of the pelvic organs (ovaries, uterus, and vagina). This screening involves a pelvic examination, including checking for microscopic changes to the surface of your cervix (Pap test). You may be encouraged to have this screening done every 3 years, beginning at age 15.  For women ages 25-65, health care providers may recommend pelvic exams and Pap testing every 3 years, or they may recommend the Pap and pelvic exam, combined with testing for human papilloma virus (HPV), every 5 years. Some types of HPV increase your risk of cervical cancer. Testing for HPV may also be done on women of any age with unclear Pap test results.  Other health care providers may not recommend any screening for nonpregnant women who are considered low risk for pelvic cancer and who do not have symptoms. Ask your health care provider if a screening pelvic exam is right for you.  If you have had past treatment for cervical cancer or a  condition that could lead to cancer, you need Pap tests and screening for cancer for at least 20 years after your treatment. If Pap tests have been discontinued, your risk factors (such as having a new sexual partner) need to be reassessed to determine if screening should resume. Some women have medical problems that increase the chance of getting cervical cancer. In these cases, your health care provider may recommend more frequent screening and Pap tests.  Colorectal Cancer  This type of cancer can be detected and often prevented.  Routine colorectal cancer screening usually begins at 39 years of age and continues through 39 years of age.  Your health care provider may recommend screening at an earlier age if you have risk factors for colon cancer.  Your health care provider may also recommend using home test kits to check for hidden blood in the stool.  A small camera at the end of a tube can be used to examine your colon directly  (sigmoidoscopy or colonoscopy). This is done to check for the earliest forms of colorectal cancer.  Routine screening usually begins at age 49.  Direct examination of the colon should be repeated every 5-10 years through 39 years of age. However, you may need to be screened more often if early forms of precancerous polyps or small growths are found.  Skin Cancer  Check your skin from head to toe regularly.  Tell your health care provider about any new moles or changes in moles, especially if there is a change in a mole's shape or color.  Also tell your health care provider if you have a mole that is larger than the size of a pencil eraser.  Always use sunscreen. Apply sunscreen liberally and repeatedly throughout the day.  Protect yourself by wearing long sleeves, pants, a wide-brimmed hat, and sunglasses whenever you are outside.  Heart disease, diabetes, and high blood pressure  High blood pressure causes heart disease and increases the risk of stroke. High blood pressure is more likely to develop in: ? People who have blood pressure in the high end of the normal range (130-139/85-89 mm Hg). ? People who are overweight or obese. ? People who are African American.  If you are 16-47 years of age, have your blood pressure checked every 3-5 years. If you are 58 years of age or older, have your blood pressure checked every year. You should have your blood pressure measured twice-once when you are at a hospital or clinic, and once when you are not at a hospital or clinic. Record the average of the two measurements. To check your blood pressure when you are not at a hospital or clinic, you can use: ? An automated blood pressure machine at a pharmacy. ? A home blood pressure monitor.  If you are between 29 years and 48 years old, ask your health care provider if you should take aspirin to prevent strokes.  Have regular diabetes screenings. This involves taking a blood sample to check your  fasting blood sugar level. ? If you are at a normal weight and have a low risk for diabetes, have this test once every three years after 39 years of age. ? If you are overweight and have a high risk for diabetes, consider being tested at a younger age or more often. Preventing infection Hepatitis B  If you have a higher risk for hepatitis B, you should be screened for this virus. You are considered at high risk for hepatitis B if: ? You  were born in a country where hepatitis B is common. Ask your health care provider which countries are considered high risk. ? Your parents were born in a high-risk country, and you have not been immunized against hepatitis B (hepatitis B vaccine). ? You have HIV or AIDS. ? You use needles to inject street drugs. ? You live with someone who has hepatitis B. ? You have had sex with someone who has hepatitis B. ? You get hemodialysis treatment. ? You take certain medicines for conditions, including cancer, organ transplantation, and autoimmune conditions.  Hepatitis C  Blood testing is recommended for: ? Everyone born from [birth year] through 1963. ? Anyone with known risk factors for hepatitis C.  Sexually transmitted infections (STIs)  You should be screened for sexually transmitted infections (STIs) including gonorrhea and chlamydia if: ? You are sexually active and are younger than 39 years of age. ? You are older than 39 years of age and your health care provider tells you that you are at risk for this type of infection. ? Your sexual activity has changed since you were last screened and you are at an increased risk for chlamydia or gonorrhea. Ask your health care provider if you are at risk.  If you do not have HIV, but are at risk, it may be recommended that you take a prescription medicine daily to prevent HIV infection. This is called pre-exposure prophylaxis (PrEP). You are considered at risk if: ? You are sexually active and do not regularly use condoms  or know the HIV status of your partner(s). ? You take drugs by injection. ? You are sexually active with a partner who has HIV.  Talk with your health care provider about whether you are at high risk of being infected with HIV. If you choose to begin PrEP, you should first be tested for HIV. You should then be tested every 3 months for as long as you are taking PrEP. Pregnancy  If you are premenopausal and you may become pregnant, ask your health care provider about preconception counseling.  If you may become pregnant, take 400 to 800 micrograms (mcg) of folic acid every day.  If you want to prevent pregnancy, talk to your health care provider about birth control (contraception). Osteoporosis and menopause  Osteoporosis is a disease in which the bones lose minerals and strength with aging. This can result in serious bone fractures. Your risk for osteoporosis can be identified using a bone density scan.  If you are 56 years of age or older, or if you are at risk for osteoporosis and fractures, ask your health care provider if you should be screened.  Ask your health care provider whether you should take a calcium or vitamin D supplement to lower your risk for osteoporosis.  Menopause may have certain physical symptoms and risks.  Hormone replacement therapy may reduce some of these symptoms and risks. Talk to your health care provider about whether hormone replacement therapy is right for you. Follow these instructions at home:  Schedule regular health, dental, and eye exams.  Stay current with your immunizations.  Do not use any tobacco products including cigarettes, chewing tobacco, or electronic cigarettes.  If you are pregnant, do not drink alcohol.  If you are breastfeeding, limit how much and how often you drink alcohol.  Limit alcohol intake to no more than 1 drink per day for nonpregnant women. One drink equals 12 ounces of beer, 5 ounces of wine, or 1 ounces of hard  liquor.  Do not use street drugs.  Do not share needles.  Ask your health care provider for help if you need support or information about quitting drugs.  Tell your health care provider if you often feel depressed.  Tell your health care provider if you have ever been abused or do not feel safe at home. This information is not intended to replace advice given to you by your health care provider. Make sure you discuss any questions you have with your health care provider. Document Released: 10/03/2010 Document Revised: 08/26/2015 Document Reviewed: 12/22/2014 Elsevier Interactive Patient Education  [employer].

## 2017-02-26 NOTE — Addendum Note (Signed)
Addended by: Johnella MoloneyFUNDERBURK, JO A on: 02/26/2017 01:53 PM   Modules accepted: Orders

## 2017-02-27 ENCOUNTER — Other Ambulatory Visit (INDEPENDENT_AMBULATORY_CARE_PROVIDER_SITE_OTHER): Payer: BLUE CROSS/BLUE SHIELD

## 2017-02-27 DIAGNOSIS — E785 Hyperlipidemia, unspecified: Secondary | ICD-10-CM

## 2017-02-27 DIAGNOSIS — R739 Hyperglycemia, unspecified: Secondary | ICD-10-CM

## 2017-02-27 LAB — LIPID PANEL
Cholesterol: 216 mg/dL — ABNORMAL HIGH (ref 0–200)
HDL: 42.2 mg/dL (ref >39.00)
LDL Cholesterol: 136 mg/dL — ABNORMAL HIGH (ref 0–99)
NonHDL: 173.99
Total CHOL/HDL Ratio: 5
Triglycerides: 189 mg/dL — ABNORMAL HIGH (ref 0.0–149.0)
VLDL: 37.8 mg/dL (ref 0.0–40.0)

## 2017-02-27 LAB — HEMOGLOBIN A1C: Hgb A1c MFr Bld: 6.1 % (ref 4.6–6.5)

## 2017-04-12 ENCOUNTER — Encounter: Payer: Self-pay | Admitting: Family Medicine

## 2017-08-30 NOTE — Progress Notes (Deleted)
  HPI:  Using dictation device. Unfortunately this device frequently misinterprets words/phrases.  Jasmine Mueller is a pleasant 42 y.o. here for follow up. Chronic medical problems summarized below were reviewed for changes. Her last visit what a physical 02/26/17.***. Denies CP, SOB, DOE, treatment intolerance or new symptoms. Due for labs, ? Pap  Obesity/Hyperlipidemia, Hyperglycemia: -lifestyle recommendations advised and consideration medication if not improving  Migraines:  Hx seasonal allergies:  Depression: -used to see Dr. Evelene Croon  ROS: See pertinent positives and negatives per HPI.  Past Medical History:  Diagnosis Date  . Allergy   . Depression   . History of chicken pox   . Migraines     No past surgical history on file.  Family History  Problem Relation Age of Onset  . Migraines Mother   . Glaucoma Father   . Hypertension Maternal Grandmother     SOCIAL HX: ***  No current outpatient medications on file.  EXAM:  There were no vitals filed for this visit.  There is no height or weight on file to calculate BMI.  GENERAL: vitals reviewed and listed above, alert, oriented, appears well hydrated and in no acute distress  HEENT: atraumatic, conjunttiva clear, no obvious abnormalities on inspection of external nose and ears  NECK: no obvious masses on inspection  LUNGS: clear to auscultation bilaterally, no wheezes, rales or rhonchi, good air movement  CV: HRRR, no peripheral edema  MS: moves all extremities without noticeable abnormality *** PSYCH: pleasant and cooperative, no obvious depression or anxiety  ASSESSMENT AND PLAN:  Discussed the following assessment and plan:  No diagnosis found.  *** -Patient advised to return or notify a doctor immediately if symptoms worsen or persist or new concerns arise.  There are no Patient Instructions on file for this visit.  Terressa Koyanagi, DO

## 2017-09-03 ENCOUNTER — Ambulatory Visit: Payer: BLUE CROSS/BLUE SHIELD | Admitting: Family Medicine

## 2017-09-03 DIAGNOSIS — Z0289 Encounter for other administrative examinations: Secondary | ICD-10-CM

## 2019-03-07 ENCOUNTER — Other Ambulatory Visit: Payer: Self-pay

## 2019-03-07 ENCOUNTER — Encounter: Payer: Self-pay | Admitting: Internal Medicine

## 2019-03-07 ENCOUNTER — Telehealth (INDEPENDENT_AMBULATORY_CARE_PROVIDER_SITE_OTHER): Payer: BC Managed Care – PPO | Admitting: Internal Medicine

## 2019-03-07 DIAGNOSIS — E785 Hyperlipidemia, unspecified: Secondary | ICD-10-CM | POA: Diagnosis not present

## 2019-03-07 DIAGNOSIS — F325 Major depressive disorder, single episode, in full remission: Secondary | ICD-10-CM | POA: Diagnosis not present

## 2019-03-07 NOTE — Progress Notes (Signed)
Virtual Visit via Telephone Note  I connected with Lita Mains on 03/07/19 at  1:30 PM EST by telephone and verified that I am speaking with the correct person using two identifiers.   I discussed the limitations, risks, security and privacy concerns of performing an evaluation and management service by telephone and the availability of in person appointments. I also discussed with the patient that there may be a patient responsible charge related to this service. The patient expressed understanding and agreed to proceed.  Location patient: home Location provider: work office Participants present for the call: patient, provider Patient did not have a visit in the prior 7 days to address this/these issue(s).   History of Present Illness:  This appointment has been scheduled to establish care.  She already has an appointment scheduled for later this month for her annual physical.  She is currently going through an international adoption and needs a health assessment form filled out by a physician.  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  She has no known drug allergies, past surgical history is only significant for wisdom teeth extraction, she is a never smoker, drinks alcohol occasionally.  Her family history is significant for a mother with hypothyroidism and a maternal grandmother with hypertension and hyperlipidemia.  She has no acute complaints today.  Her past medical history significant for allergic rhinitis, hyperlipidemia that is not on medications, migraine headaches without any issues recently and history of depression that is currently controlled not on medications she has gone through CBT sessions in the past.   Observations/Objective: Patient sounds cheerful and well on the phone. I do not appreciate any increased work of breathing. Speech and thought processing are grossly intact. Patient reported vitals:   No current outpatient  medications on file.  Review of Systems:  Constitutional: Denies fever, chills, diaphoresis, appetite change and fatigue.  HEENT: Denies photophobia, eye pain, redness, hearing loss, ear pain, congestion, sore throat, rhinorrhea, sneezing, mouth sores, trouble swallowing, neck pain, neck stiffness and tinnitus.   Respiratory: Denies SOB, DOE, cough, chest tightness,  and wheezing.   Cardiovascular: Denies chest pain, palpitations and leg swelling.  Gastrointestinal: Denies nausea, vomiting, abdominal pain, diarrhea, constipation, blood in stool and abdominal distention.  Genitourinary: Denies dysuria, urgency, frequency, hematuria, flank pain and difficulty urinating.  Endocrine: Denies: hot or cold intolerance, sweats, changes in hair or nails, polyuria, polydipsia. Musculoskeletal: Denies myalgias, back pain, joint swelling, arthralgias and gait problem.  Skin: Denies pallor, rash and wound.  Neurological: Denies dizziness, seizures, syncope, weakness, light-headedness, numbness and headaches.  Hematological: Denies adenopathy. Easy bruising, personal or family bleeding history  Psychiatric/Behavioral: Denies suicidal ideation, mood changes, confusion, nervousness, sleep disturbance and agitation   Assessment and Plan:  Hyperlipidemia, unspecified hyperlipidemia type -Last LDL was 136 in November 2018. -She is not on medications. -Recheck lipids when she returns for physical.  Depression, major, single episode, complete remission ([place]) -Not active.    I discussed the assessment and treatment plan with the patient. The patient was provided an opportunity to ask questions and all were answered. The patient agreed with the plan and demonstrated an understanding of the instructions.   The patient was advised to call back or seek an in-person evaluation if the symptoms worsen or if the condition fails to improve as anticipated.  I provided 15 minutes of non-face-to-face time during  this encounter.   Lelon Frohlich, MD [facility]

## 2019-03-26 ENCOUNTER — Encounter: Payer: Self-pay | Admitting: Internal Medicine

## 2019-03-26 ENCOUNTER — Ambulatory Visit (INDEPENDENT_AMBULATORY_CARE_PROVIDER_SITE_OTHER): Payer: BC Managed Care – PPO | Admitting: Internal Medicine

## 2019-03-26 ENCOUNTER — Other Ambulatory Visit: Payer: Self-pay

## 2019-03-26 VITALS — BP 130/90 | HR 82 | Temp 97.3°F | Ht 66.0 in | Wt 198.9 lb

## 2019-03-26 DIAGNOSIS — Z124 Encounter for screening for malignant neoplasm of cervix: Secondary | ICD-10-CM | POA: Diagnosis not present

## 2019-03-26 DIAGNOSIS — E669 Obesity, unspecified: Secondary | ICD-10-CM

## 2019-03-26 DIAGNOSIS — F325 Major depressive disorder, single episode, in full remission: Secondary | ICD-10-CM

## 2019-03-26 DIAGNOSIS — E785 Hyperlipidemia, unspecified: Secondary | ICD-10-CM

## 2019-03-26 DIAGNOSIS — Z0000 Encounter for general adult medical examination without abnormal findings: Secondary | ICD-10-CM

## 2019-03-26 DIAGNOSIS — R030 Elevated blood-pressure reading, without diagnosis of hypertension: Secondary | ICD-10-CM

## 2019-03-26 DIAGNOSIS — Z23 Encounter for immunization: Secondary | ICD-10-CM

## 2019-03-26 LAB — CBC WITH DIFFERENTIAL/PLATELET
Basophils Absolute: 0.1 10*3/uL (ref 0.0–0.1)
Basophils Relative: 1.3 % (ref 0.0–3.0)
Eosinophils Absolute: 0.2 10*3/uL (ref 0.0–0.7)
Eosinophils Relative: 3.5 % (ref 0.0–5.0)
HCT: 39.7 % (ref 36.0–46.0)
Hemoglobin: 13.6 g/dL (ref 12.0–15.0)
Lymphocytes Relative: 28.8 % (ref 12.0–46.0)
Lymphs Abs: 2 10*3/uL (ref 0.7–4.0)
MCHC: 34.2 g/dL (ref 30.0–36.0)
MCV: 85.2 fl (ref 78.0–100.0)
Monocytes Absolute: 0.4 10*3/uL (ref 0.1–1.0)
Monocytes Relative: 5.3 % (ref 3.0–12.0)
Neutro Abs: 4.2 10*3/uL (ref 1.4–7.7)
Neutrophils Relative %: 61.1 % (ref 43.0–77.0)
Platelets: 321 10*3/uL (ref 150.0–400.0)
RBC: 4.66 Mil/uL (ref 3.87–5.11)
RDW: 13.1 % (ref 11.5–15.5)
WBC: 6.9 10*3/uL (ref 4.0–10.5)

## 2019-03-26 LAB — LIPID PANEL
Cholesterol: 208 mg/dL — ABNORMAL HIGH (ref 0–200)
HDL: 40 mg/dL (ref >39.00)
NonHDL: 168.3
Total CHOL/HDL Ratio: 5
Triglycerides: 376 mg/dL — ABNORMAL HIGH (ref 0.0–149.0)
VLDL: 75.2 mg/dL — ABNORMAL HIGH (ref 0.0–40.0)

## 2019-03-26 LAB — COMPREHENSIVE METABOLIC PANEL
ALT: 63 U/L — ABNORMAL HIGH (ref 0–35)
AST: 41 U/L — ABNORMAL HIGH (ref 0–37)
Albumin: 4.3 g/dL (ref 3.5–5.2)
Alkaline Phosphatase: 78 U/L (ref 39–117)
BUN: 14 mg/dL (ref 6–23)
CO2: 25 mEq/L (ref 19–32)
Calcium: 9.3 mg/dL (ref 8.4–10.5)
Chloride: 100 mEq/L (ref 96–112)
Creatinine, Ser: 0.65 mg/dL (ref 0.40–1.20)
GFR: 100.06 mL/min (ref >60.00)
Glucose, Bld: 141 mg/dL — ABNORMAL HIGH (ref 70–99)
Potassium: 4 mEq/L (ref 3.5–5.1)
Sodium: 134 mEq/L — ABNORMAL LOW (ref 135–145)
Total Bilirubin: 0.6 mg/dL (ref 0.2–1.2)
Total Protein: 7 g/dL (ref 6.0–8.3)

## 2019-03-26 LAB — VITAMIN D 25 HYDROXY (VIT D DEFICIENCY, FRACTURES): VITD: 19.7 ng/mL — ABNORMAL LOW (ref 30.00–100.00)

## 2019-03-26 LAB — LDL CHOLESTEROL, DIRECT: Direct LDL: 99 mg/dL

## 2019-03-26 LAB — TSH: TSH: 1.1 u[IU]/mL (ref 0.35–4.50)

## 2019-03-26 LAB — VITAMIN B12: Vitamin B-12: 249 pg/mL (ref 211–911)

## 2019-03-26 LAB — HEMOGLOBIN A1C: Hgb A1c MFr Bld: 7.1 % — ABNORMAL HIGH (ref 4.6–6.5)

## 2019-03-26 NOTE — Progress Notes (Signed)
Established Patient Office Visit     This visit occurred during the SARS-CoV-2 public health emergency.  Safety protocols were in place, including screening questions prior to the visit, additional usage of staff PPE, and extensive cleaning of exam room while observing appropriate contact time as indicated for disinfecting solutions.    CC/Reason for Visit: Annual preventive exam  HPI: Jasmine Mueller is a 43 y.o. female who is coming in today for the above mentioned reasons. Past Medical History is significant for: allergic rhinitis, hyperlipidemia that is not on medications, migraine headaches without any issues recently and history of depression that is currently controlled not on medications she has gone through CBT sessions in the past.  She has no acute complaints today.  She needs paperwork filled out for international adoption.  She has routine eye and dental care.  Referral to GYN today.   Past Medical/Surgical History: Past Medical History:  Diagnosis Date  . Allergy   . Depression   . History of chicken pox   . Migraines     Past Surgical History:  Procedure Laterality Date  . WISDOM TOOTH EXTRACTION      Social History:  reports that she has never smoked. She has never used smokeless tobacco. She reports current alcohol use. She reports that she does not use drugs.  Allergies: No Known Allergies  Family History:  Family History  Problem Relation Age of Onset  . Migraines Mother   . Hypothyroidism Mother   . Glaucoma Father   . Hypertension Maternal Grandmother   . Hyperlipidemia Maternal Grandmother     No current outpatient medications on file.  Review of Systems:  Constitutional: Denies fever, chills, diaphoresis, appetite change and fatigue.  HEENT: Denies photophobia, eye pain, redness, hearing loss, ear pain, congestion, sore throat, rhinorrhea, sneezing, mouth sores, trouble swallowing, neck pain, neck stiffness and tinnitus.   Respiratory:  Denies SOB, DOE, cough, chest tightness,  and wheezing.   Cardiovascular: Denies chest pain, palpitations and leg swelling.  Gastrointestinal: Denies nausea, vomiting, abdominal pain, diarrhea, constipation, blood in stool and abdominal distention.  Genitourinary: Denies dysuria, urgency, frequency, hematuria, flank pain and difficulty urinating.  Endocrine: Denies: hot or cold intolerance, sweats, changes in hair or nails, polyuria, polydipsia. Musculoskeletal: Denies myalgias, back pain, joint swelling, arthralgias and gait problem.  Skin: Denies pallor, rash and wound.  Neurological: Denies dizziness, seizures, syncope, weakness, light-headedness, numbness and headaches.  Hematological: Denies adenopathy. Easy bruising, personal or family bleeding history  Psychiatric/Behavioral: Denies suicidal ideation, mood changes, confusion, nervousness, sleep disturbance and agitation    Physical Exam: Vitals:   03/26/19 0836  BP: 130/90  Pulse: 82  Temp: (!) 97.3 F (36.3 C)  TempSrc: Temporal  SpO2: 95%  Weight: 198 lb 14.4 oz (90.2 kg)  Height: '5\' 6"'  (1.676 m)    Body mass index is 32.1 kg/m.   Constitutional: NAD, calm, comfortable Eyes: PERRL, lids and conjunctivae normal ENMT: Mucous membranes are moist.  Tympanic membrane is pearly white, no erythema or bulging. Neck: normal, supple, no masses, no thyromegaly Respiratory: clear to auscultation bilaterally, no wheezing, no crackles. Normal respiratory effort. No accessory muscle use.  Cardiovascular: Regular rate and rhythm, no murmurs / rubs / gallops. No extremity edema. 2+ pedal pulses.  Abdomen: no tenderness, no masses palpated. No hepatosplenomegaly. Bowel sounds positive.  Musculoskeletal: no clubbing / cyanosis. No joint deformity upper and lower extremities. Good ROM, no contractures. Normal muscle tone.  Skin: no rashes, lesions, ulcers. No induration  Neurologic: CN 2-12 grossly intact. Sensation intact, DTR normal.  Strength 5/5 in all 4.  Psychiatric: Normal judgment and insight. Alert and oriented x 3. Normal mood.    Impression and Plan:  Encounter for preventive health examination  -She has routine eye and dental care. -Flu vaccine today, otherwise immunizations are up-to-date and age-appropriate. -Screening labs today. -Healthy lifestyle has been discussed in detail. -Commence routine colon cancer screening at age 13. -We will refer for screening mammogram. -GYN referral has been placed.  Hyperlipidemia, unspecified hyperlipidemia type  - Plan: Lipid panel  Depression, major, single episode, complete remission ([place])    Office Visit from 03/26/2019 in [facility] [place]  PHQ-9 Total Score  1     -Mood is stable, not on medications, she has attended CBT sessions in the past.  Obesity (BMI 30.0-34.9) -Discussed healthy lifestyle, including increased physical activity and better food choices to promote weight loss.  Elevated BP without diagnosis of hypertension -Blood pressure in office is 130/90. -Have advised ambulatory blood pressure monitoring and for her to contact me if consistently greater than 130/80 to discuss further steps.    Patient Instructions  -Nice seeing you today!!  -Lab work today; will notify you once results are available.  -Flu vaccine today.  -Check blood pressure at home 2-3 times a week and call if consistently >130/80.  Schedule follow up in 6 months.   Preventive Care 33-86 Years Old, Female Preventive care refers to visits with your health care provider and lifestyle choices that can promote health and wellness. This includes:  A yearly physical exam. This may also be called an annual well check.  Regular dental visits and eye exams.  Immunizations.  Screening for certain conditions.  Healthy lifestyle choices, such as eating a healthy diet, getting regular exercise, not using drugs or products that contain nicotine and tobacco,  and limiting alcohol use. What can I expect for my preventive care visit? Physical exam Your health care provider will check your:  Height and weight. This may be used to calculate body mass index (BMI), which tells if you are at a healthy weight.  Heart rate and blood pressure.  Skin for abnormal spots. Counseling Your health care provider may ask you questions about your:  Alcohol, tobacco, and drug use.  Emotional well-being.  Home and relationship well-being.  Sexual activity.  Eating habits.  Work and work Statistician.  Method of birth control.  Menstrual cycle.  Pregnancy history. What immunizations do I need?  Influenza (flu) vaccine  This is recommended every year. Tetanus, diphtheria, and pertussis (Tdap) vaccine  You may need a Td booster every 10 years. Varicella (chickenpox) vaccine  You may need this if you have not been vaccinated. Human papillomavirus (HPV) vaccine  If recommended by your health care provider, you may need three doses over 6 months. Measles, mumps, and rubella (MMR) vaccine  You may need at least one dose of MMR. You may also need a second dose. Meningococcal conjugate (MenACWY) vaccine  One dose is recommended if you are age 33-21 years and a first-year college student living in a residence hall, or if you have one of several medical conditions. You may also need additional booster doses. Pneumococcal conjugate (PCV13) vaccine  You may need this if you have certain conditions and were not previously vaccinated. Pneumococcal polysaccharide (PPSV23) vaccine  You may need one or two doses if you smoke cigarettes or if you have certain conditions. Hepatitis A vaccine  You may  need this if you have certain conditions or if you travel or work in places where you may be exposed to hepatitis A. Hepatitis B vaccine  You may need this if you have certain conditions or if you travel or work in places where you may be exposed to  hepatitis B. Haemophilus influenzae type b (Hib) vaccine  You may need this if you have certain conditions. You may receive vaccines as individual doses or as more than one vaccine together in one shot (combination vaccines). Talk with your health care provider about the risks and benefits of combination vaccines. What tests do I need?  Blood tests  Lipid and cholesterol levels. These may be checked every 5 years starting at age 45.  Hepatitis C test.  Hepatitis B test. Screening  Diabetes screening. This is done by checking your blood sugar (glucose) after you have not eaten for a while (fasting).  Sexually transmitted disease (STD) testing.  BRCA-related cancer screening. This may be done if you have a family history of breast, ovarian, tubal, or peritoneal cancers.  Pelvic exam and Pap test. This may be done every 3 years starting at age 45. Starting at age 61, this may be done every 5 years if you have a Pap test in combination with an HPV test. Talk with your health care provider about your test results, treatment options, and if necessary, the need for more tests. Follow these instructions at home: Eating and drinking   Eat a diet that includes fresh fruits and vegetables, whole grains, lean protein, and low-fat dairy.  Take vitamin and mineral supplements as recommended by your health care provider.  Do not drink alcohol if: ? Your health care provider tells you not to drink. ? You are pregnant, may be pregnant, or are planning to become pregnant.  If you drink alcohol: ? Limit how much you have to 0-1 drink a day. ? Be aware of how much alcohol is in your drink. In the [place] one drink equals one 12 oz bottle of beer (355 mL), one 5 oz glass of wine (148 mL), or one 1 oz glass of hard liquor (44 mL). Lifestyle  Take daily care of your teeth and gums.  Stay active. Exercise for at least 30 minutes on 5 or more days each week.  Do not use any products that contain  nicotine or tobacco, such as cigarettes, e-cigarettes, and chewing tobacco. If you need help quitting, ask your health care provider.  If you are sexually active, practice safe sex. Use a condom or other form of birth control (contraception) in order to prevent pregnancy and STIs (sexually transmitted infections). If you plan to become pregnant, see your health care provider for a preconception visit. What's next?  Visit your health care provider once a year for a well check visit.  Ask your health care provider how often you should have your eyes and teeth checked.  Stay up to date on all vaccines. This information is not intended to replace advice given to you by your health care provider. Make sure you discuss any questions you have with your health care provider. Document Released: 05/16/2001 Document Revised: 11/29/2017 Document Reviewed: 11/29/2017 Elsevier Patient Education  2020 [place], MD [facility]

## 2019-03-26 NOTE — Patient Instructions (Addendum)
-Nice seeing you today!!  -Lab work today; will notify you once results are available.  -Flu vaccine today.  -Check blood pressure at home 2-3 times a week and call if consistently >130/80.  Schedule follow up in 6 months.   Preventive Care 70-41 Years Old, [place], Female Preventive care refers to visits with your health care provider and lifestyle choices that can promote health and wellness. This includes:  A yearly physical exam. This may also be called an annual well check.  Regular dental visits and eye exams.  Immunizations.  Screening for certain conditions.  Healthy lifestyle choices, such as eating a healthy diet, getting regular exercise, not using drugs or products that contain nicotine and tobacco, and limiting alcohol use. What can I expect for my preventive care visit? Physical exam Your health care provider will check your:  Height and weight. This may be used to calculate body mass index (BMI), which tells if you are at a healthy weight.  Heart rate and blood pressure.  Skin for abnormal spots. Counseling Your health care provider may ask you questions about your:  Alcohol, tobacco, and drug use.  Emotional well-being.  Home and relationship well-being.  Sexual activity.  Eating habits.  Work and work Statistician.  Method of birth control.  Menstrual cycle.  Pregnancy history. What immunizations do I need?  Influenza (flu) vaccine  This is recommended every year. Tetanus, diphtheria, and pertussis (Tdap) vaccine  You may need a Td booster every 10 years. Varicella (chickenpox) vaccine  You may need this if you have not been vaccinated. Human papillomavirus (HPV) vaccine  If recommended by your health care provider, you may need three doses over 6 months. Measles, mumps, and rubella (MMR) vaccine  You may need at least one dose of MMR. You may also need a second dose. Meningococcal conjugate (MenACWY) vaccine  One dose is recommended if  you are age 74-21 years and a first-year college student living in a residence hall, or if you have one of several medical conditions. You may also need additional booster doses. Pneumococcal conjugate (PCV13) vaccine  You may need this if you have certain conditions and were not previously vaccinated. Pneumococcal polysaccharide (PPSV23) vaccine  You may need one or two doses if you smoke cigarettes or if you have certain conditions. Hepatitis A vaccine  You may need this if you have certain conditions or if you travel or work in places where you may be exposed to hepatitis A. Hepatitis B vaccine  You may need this if you have certain conditions or if you travel or work in places where you may be exposed to hepatitis B. Haemophilus influenzae type b (Hib) vaccine  You may need this if you have certain conditions. You may receive vaccines as individual doses or as more than one vaccine together in one shot (combination vaccines). Talk with your health care provider about the risks and benefits of combination vaccines. What tests do I need?  Blood tests  Lipid and cholesterol levels. These may be checked every 5 years starting at age 41.  Hepatitis C test.  Hepatitis B test. Screening  Diabetes screening. This is done by checking your blood sugar (glucose) after you have not eaten for a while (fasting).  Sexually transmitted disease (STD) testing.  BRCA-related cancer screening. This may be done if you have a family history of breast, ovarian, tubal, or peritoneal cancers.  Pelvic exam and Pap test. This may be done every 3 years starting at age 41.  Starting at age 41, this may be done every 5 years if you have a Pap test in combination with an HPV test. Talk with your health care provider about your test results, treatment options, and if necessary, the need for more tests. Follow these instructions at home: Eating and drinking   Eat a diet that includes fresh fruits and  vegetables, whole grains, lean protein, and low-fat dairy.  Take vitamin and mineral supplements as recommended by your health care provider.  Do not drink alcohol if: ? Your health care provider tells you not to drink. ? You are pregnant, may be pregnant, or are planning to become pregnant.  If you drink alcohol: ? Limit how much you have to 0-1 drink a day. ? Be aware of how much alcohol is in your drink. In the [place] one drink equals one 12 oz bottle of beer (355 mL), one 5 oz glass of wine (148 mL), or one 1 oz glass of hard liquor (44 mL). Lifestyle  Take daily care of your teeth and gums.  Stay active. Exercise for at least 30 minutes on 5 or more days each week.  Do not use any products that contain nicotine or tobacco, such as cigarettes, e-cigarettes, and chewing tobacco. If you need help quitting, ask your health care provider.  If you are sexually active, practice safe sex. Use a condom or other form of birth control (contraception) in order to prevent pregnancy and STIs (sexually transmitted infections). If you plan to become pregnant, see your health care provider for a preconception visit. What's next?  Visit your health care provider once a year for a well check visit.  Ask your health care provider how often you should have your eyes and teeth checked.  Stay up to date on all vaccines. This information is not intended to replace advice given to you by your health care provider. Make sure you discuss any questions you have with your health care provider. Document Released: 05/16/2001 Document Revised: 11/29/2017 Document Reviewed: 11/29/2017 Elsevier Patient Education  2020 [employer].

## 2019-03-27 ENCOUNTER — Other Ambulatory Visit: Payer: Self-pay | Admitting: Internal Medicine

## 2019-03-27 ENCOUNTER — Encounter: Payer: Self-pay | Admitting: Internal Medicine

## 2019-03-27 DIAGNOSIS — E1169 Type 2 diabetes mellitus with other specified complication: Secondary | ICD-10-CM

## 2019-03-27 DIAGNOSIS — E559 Vitamin D deficiency, unspecified: Secondary | ICD-10-CM

## 2019-03-27 DIAGNOSIS — R7401 Elevation of levels of liver transaminase levels: Secondary | ICD-10-CM | POA: Insufficient documentation

## 2019-03-27 DIAGNOSIS — E785 Hyperlipidemia, unspecified: Secondary | ICD-10-CM

## 2019-03-27 DIAGNOSIS — E1165 Type 2 diabetes mellitus with hyperglycemia: Secondary | ICD-10-CM | POA: Insufficient documentation

## 2019-03-27 DIAGNOSIS — IMO0002 Reserved for concepts with insufficient information to code with codable children: Secondary | ICD-10-CM | POA: Insufficient documentation

## 2019-03-27 MED ORDER — ATORVASTATIN CALCIUM 20 MG PO TABS: 20.0000 mg | ORAL_TABLET | Freq: Every day | ORAL | 1 refills | Status: DC

## 2019-03-27 MED ORDER — METFORMIN HCL 500 MG PO TABS: 500.0000 mg | ORAL_TABLET | Freq: Two times a day (BID) | ORAL | 1 refills | Status: DC

## 2019-04-02 ENCOUNTER — Other Ambulatory Visit: Payer: Self-pay | Admitting: Internal Medicine

## 2019-04-02 DIAGNOSIS — R7401 Elevation of levels of liver transaminase levels: Secondary | ICD-10-CM

## 2019-04-02 DIAGNOSIS — E559 Vitamin D deficiency, unspecified: Secondary | ICD-10-CM

## 2019-04-02 MED ORDER — VITAMIN D (ERGOCALCIFEROL) 1.25 MG (50000 UNIT) PO CAPS: 50000.0000 [IU] | ORAL_CAPSULE | ORAL | 0 refills | Status: DC

## 2019-04-17 ENCOUNTER — Ambulatory Visit
Admission: RE | Admit: 2019-04-17 | Discharge: 2019-04-17 | Disposition: A | Discharge status: Home / Self Care | Payer: BLUE CROSS/BLUE SHIELD | Source: Ambulatory Visit | Attending: Internal Medicine | Admitting: Internal Medicine

## 2019-04-17 DIAGNOSIS — R7401 Elevation of levels of liver transaminase levels: Secondary | ICD-10-CM

## 2019-05-26 ENCOUNTER — Other Ambulatory Visit: Payer: Self-pay

## 2019-05-27 ENCOUNTER — Encounter: Payer: Self-pay | Admitting: Women's Health

## 2019-05-27 ENCOUNTER — Ambulatory Visit: Payer: BC Managed Care – PPO | Admitting: Women's Health

## 2019-05-27 VITALS — BP 128/80 | Ht 66.0 in | Wt 186.0 lb

## 2019-05-27 DIAGNOSIS — Z01419 Encounter for gynecological examination (general) (routine) without abnormal findings: Secondary | ICD-10-CM | POA: Diagnosis not present

## 2019-05-27 NOTE — Patient Instructions (Addendum)
It was nice meeting you today Vitamin D 2000 IUs daily after completing prescription vitamin D [facility] for [facility]ogram [phone] Health Maintenance, Female Adopting a healthy lifestyle and getting preventive care are important in promoting health and wellness. Ask your health care provider about:  The right schedule for you to have regular tests and exams.  Things you can do on your own to prevent diseases and keep yourself healthy. What should I know about diet, weight, and exercise? Eat a healthy diet   Eat a diet that includes plenty of vegetables, fruits, low-fat dairy products, and lean protein.  Do not eat a lot of foods that are high in solid fats, added sugars, or sodium. Maintain a healthy weight Body mass index (BMI) is used to identify weight problems. It estimates body fat based on height and weight. Your health care provider can help determine your BMI and help you achieve or maintain a healthy weight. Get regular exercise Get regular exercise. This is one of the most important things you can do for your health. Most adults should:  Exercise for at least 150 minutes each week. The exercise should increase your heart rate and make you sweat (moderate-intensity exercise).  Do strengthening exercises at least twice a week. This is in addition to the moderate-intensity exercise.  Spend less time sitting. Even light physical activity can be beneficial. Watch cholesterol and blood lipids Have your blood tested for lipids and cholesterol at 43 years of age, then have this test every 5 years. Have your cholesterol levels checked more often if:  Your lipid or cholesterol levels are high.  You are older than 42 years of age.  You are at high risk for heart disease. What should I know about cancer screening? Depending on your health history and family history, you may need to have cancer screening at various ages. This may include screening for:  Breast cancer.  Cervical  cancer.  Colorectal cancer.  Skin cancer.  Lung cancer. What should I know about heart disease, diabetes, and high blood pressure? Blood pressure and heart disease  High blood pressure causes heart disease and increases the risk of stroke. This is more likely to develop in people who have high blood pressure readings, are of African descent, or are overweight.  Have your blood pressure checked: ? Every 3-5 years if you are 1-39 years of age. ? Every year if you are 99 years old or older. Diabetes Have regular diabetes screenings. This checks your fasting blood sugar level. Have the screening done:  Once every three years after age 24 if you are at a normal weight and have a low risk for diabetes.  More often and at a younger age if you are overweight or have a high risk for diabetes. What should I know about preventing infection? Hepatitis B If you have a higher risk for hepatitis B, you should be screened for this virus. Talk with your health care provider to find out if you are at risk for hepatitis B infection. Hepatitis C Testing is recommended for:  Everyone born from [birth year] through 1963.  Anyone with known risk factors for hepatitis C. Sexually transmitted infections (STIs)  Get screened for STIs, including gonorrhea and chlamydia, if: ? You are sexually active and are younger than 42 years of age. ? You are older than 42 years of age and your health care provider tells you that you are at risk for this type of infection. ? Your sexual activity has changed  since you were last screened, and you are at increased risk for chlamydia or gonorrhea. Ask your health care provider if you are at risk.  Ask your health care provider about whether you are at high risk for HIV. Your health care provider may recommend a prescription medicine to help prevent HIV infection. If you choose to take medicine to prevent HIV, you should first get tested for HIV. You should then be tested every 3  months for as long as you are taking the medicine. Pregnancy  If you are about to stop having your period (premenopausal) and you may become pregnant, seek counseling before you get pregnant.  Take 400 to 800 micrograms (mcg) of folic acid every day if you become pregnant.  Ask for birth control (contraception) if you want to prevent pregnancy. Osteoporosis and menopause Osteoporosis is a disease in which the bones lose minerals and strength with aging. This can result in bone fractures. If you are 61 years old or older, or if you are at risk for osteoporosis and fractures, ask your health care provider if you should:  Be screened for bone loss.  Take a calcium or vitamin D supplement to lower your risk of fractures.  Be given hormone replacement therapy (HRT) to treat symptoms of menopause. Follow these instructions at home: Lifestyle  Do not use any products that contain nicotine or tobacco, such as cigarettes, e-cigarettes, and chewing tobacco. If you need help quitting, ask your health care provider.  Do not use street drugs.  Do not share needles.  Ask your health care provider for help if you need support or information about quitting drugs. Alcohol use  Do not drink alcohol if: ? Your health care provider tells you not to drink. ? You are pregnant, may be pregnant, or are planning to become pregnant.  If you drink alcohol: ? Limit how much you use to 0-1 drink a day. ? Limit intake if you are breastfeeding.  Be aware of how much alcohol is in your drink. In the [place] one drink equals one 12 oz bottle of beer (355 mL), one 5 oz glass of wine (148 mL), or one 1 oz glass of hard liquor (44 mL). General instructions  Schedule regular health, dental, and eye exams.  Stay current with your vaccines.  Tell your health care provider if: ? You often feel depressed. ? You have ever been abused or do not feel safe at home. Summary  Adopting a healthy lifestyle and getting  preventive care are important in promoting health and wellness.  Follow your health care provider's instructions about healthy diet, exercising, and getting tested or screened for diseases.  Follow your health care provider's instructions on monitoring your cholesterol and blood pressure. This information is not intended to replace advice given to you by your health care provider. Make sure you discuss any questions you have with your health care provider. Document Revised: 03/13/2018 Document Reviewed: 03/13/2018 Elsevier Patient Education  2020 Else[employer] Inc.  Carbohydrate Counting for Diabetes Mellitus, Adult  Carbohydrate counting is a method of keeping track of how many carbohydrates you eat. Eating carbohydrates naturally increases the amount of sugar (glucose) in the blood. Counting how many carbohydrates you eat helps keep your blood glucose within normal limits, which helps you manage your diabetes (diabetes mellitus). It is important to know how many carbohydrates you can safely have in each meal. This is different for every person. A diet and nutrition specialist (registered dietitian) can help you make  a meal plan and calculate how many carbohydrates you should have at each meal and snack. Carbohydrates are found in the following foods:  Grains, such as breads and cereals.  Dried beans and soy products.  Starchy vegetables, such as potatoes, peas, and corn.  Fruit and fruit juices.  Milk and yogurt.  Sweets and snack foods, such as cake, cookies, candy, chips, and soft drinks. How do I count carbohydrates? There are two ways to count carbohydrates in food. You can use either of the methods or a combination of both. Reading "Nutrition Facts" on packaged food The "Nutrition Facts" list is included on the labels of almost all packaged foods and beverages in the [place] It includes:  The serving size.  Information about nutrients in each serving, including the grams (g) of  carbohydrate per serving. To use the "Nutrition Facts":  Decide how many servings you will have.  Multiply the number of servings by the number of carbohydrates per serving.  The resulting number is the total amount of carbohydrates that you will be having. Learning standard serving sizes of other foods When you eat carbohydrate foods that are not packaged or do not include "Nutrition Facts" on the label, you need to measure the servings in order to count the amount of carbohydrates:  Measure the foods that you will eat with a food scale or measuring cup, if needed.  Decide how many standard-size servings you will eat.  Multiply the number of servings by 15. Most carbohydrate-rich foods have about 15 g of carbohydrates per serving. ? For example, if you eat 8 oz (170 g) of strawberries, you will have eaten 2 servings and 30 g of carbohydrates (2 servings x 15 g = 30 g).  For foods that have more than one food mixed, such as soups and casseroles, you must count the carbohydrates in each food that is included. The following list contains standard serving sizes of common carbohydrate-rich foods. Each of these servings has about 15 g of carbohydrates:   hamburger bun or  English muffin.   oz (15 mL) syrup.   oz (14 g) jelly.  1 slice of bread.  1 six-inch tortilla.  3 oz (85 g) cooked rice or pasta.  4 oz (113 g) cooked dried beans.  4 oz (113 g) starchy vegetable, such as peas, corn, or potatoes.  4 oz (113 g) hot cereal.  4 oz (113 g) mashed potatoes or  of a large baked potato.  4 oz (113 g) canned or frozen fruit.  4 oz (120 mL) fruit juice.  4-6 crackers.  6 chicken nuggets.  6 oz (170 g) unsweetened dry cereal.  6 oz (170 g) plain fat-free yogurt or yogurt sweetened with artificial sweeteners.  8 oz (240 mL) milk.  8 oz (170 g) fresh fruit or one small piece of fruit.  24 oz (680 g) popped popcorn. Example of carbohydrate counting Sample meal  3 oz  (85 g) chicken breast.  6 oz (170 g) brown rice.  4 oz (113 g) corn.  8 oz (240 mL) milk.  8 oz (170 g) strawberries with sugar-free whipped topping. Carbohydrate calculation 1. Identify the foods that contain carbohydrates: ? Rice. ? Corn. ? Milk. ? Strawberries. 2. Calculate how many servings you have of each food: ? 2 servings rice. ? 1 serving corn. ? 1 serving milk. ? 1 serving strawberries. 3. Multiply each number of servings by 15 g: ? 2 servings rice x 15 g = 30 g. ?  1 serving corn x 15 g = 15 g. ? 1 serving milk x 15 g = 15 g. ? 1 serving strawberries x 15 g = 15 g. 4. Add together all of the amounts to find the total grams of carbohydrates eaten: ? 30 g + 15 g + 15 g + 15 g = 75 g of carbohydrates total. Summary  Carbohydrate counting is a method of keeping track of how many carbohydrates you eat.  Eating carbohydrates naturally increases the amount of sugar (glucose) in the blood.  Counting how many carbohydrates you eat helps keep your blood glucose within normal limits, which helps you manage your diabetes.  A diet and nutrition specialist (registered dietitian) can help you make a meal plan and calculate how many carbohydrates you should have at each meal and snack. This information is not intended to replace advice given to you by your health care provider. Make sure you discuss any questions you have with your health care provider. Document Revised: 10/12/2016 Document Reviewed: 09/01/2015 Elsevier Patient Education  [place].

## 2019-05-27 NOTE — Progress Notes (Signed)
Jasmine Mueller 1975/05/31 [id number]    History:    44 yo presents for new patient annual exam. Monthly 4-5 day regular cycles, no contraception. Last pap was in 2016- normal. No mammogram. Recently diagnosed with diabetes, high cholesterol, and vitamin D deficiency. PCP manages her medications for these conditions. 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 Husband also works from home. Has been staying active by taking walks with two dogs, likes to hike when it is warmer. In the process of losing weight- has lost 12 lbs since 03/26/19.  History of infertility, had IUI's and no pregnancies. I would pry put this part  Past medical history, past surgical history, family history and social history were all reviewed and documented in the EPIC chart. Diagnosis of infertility in 2016, indeterminate cause.   ROS:  A ROS was performed and pertinent positives and negatives are included.  Exam:  Vitals:   05/27/19 1417  BP: 128/80  Weight: 186 lb (84.4 kg)  Height: '5\' 6"'  (1.676 m)   Body mass index is 30.02 kg/m.   General appearance:  Normal Thyroid:  Symmetrical, normal in size, without palpable masses or nodularity. Respiratory  Auscultation:  Clear without wheezing or rhonchi Cardiovascular  Auscultation:  Regular rate, without rubs, murmurs or gallops  Edema/varicosities:  Not grossly evident Abdominal  Soft,nontender, without masses, guarding or rebound.  Liver/spleen:  No organomegaly noted  Hernia:  None appreciated  Skin  Inspection:  Grossly normal   Breasts: Examined lying and sitting.     Right: Without masses, retractions, discharge or axillary adenopathy.     Left: Without masses, retractions, discharge or axillary adenopathy. Gentitourinary   Inguinal/mons:  Normal without inguinal adenopathy  External genitalia:  Normal  BUS/Urethra/Skene's glands:  Normal  Vagina:  Normal  Cervix:  Normal  Uterus:  Normal in size, shape and contour.  Midline and mobile  Adnexa/parametria:      Rt: Without masses or tenderness.   Lt: Without masses or tenderness.  Anus and perineum: Normal  Digital rectal exam: Normal sphincter tone without palpated masses or tenderness  Assessment/Plan:  44 y.o. MAF  G0P0 for annual exam with no complaints.    Monthly cycles- no contraception, pregnancy okay/infertility history declines any further intervention Type II Diabetes, Dyslipidemia,Vitamin D deficiency primary care manages labs and meds   Plan: Pap with HR HPV screening, new guidelines reviewed. Referred to breast center for screening mammogram, phone number given instructed to schedule. Discussed infertility, declines any further intervention, briefly reviewed foster to adopt. Counseled on importance of SBEs, exercise, calcium rich foods, annual mammogram screenings, low carb diet, and avoidance of simple sugars. Recommended over-the-counter vitamin D once the prescription vitamin D is completed.   Huel Cote [facility], 2:45 PM 05/27/2019

## 2019-05-28 LAB — PAP, TP IMAGING W/ HPV RNA, RFLX HPV TYPE 16,18/45: HPV DNA High Risk: NOT DETECTED

## 2019-06-27 ENCOUNTER — Ambulatory Visit: Payer: BC Managed Care – PPO | Admitting: Internal Medicine

## 2019-08-05 ENCOUNTER — Other Ambulatory Visit: Payer: Self-pay

## 2019-08-05 DIAGNOSIS — F3342 Major depressive disorder, recurrent, in full remission: Secondary | ICD-10-CM | POA: Diagnosis not present

## 2019-08-06 ENCOUNTER — Ambulatory Visit (INDEPENDENT_AMBULATORY_CARE_PROVIDER_SITE_OTHER): Payer: BC Managed Care – PPO | Admitting: Internal Medicine

## 2019-08-06 VITALS — BP 120/70 | HR 72 | Temp 97.6°F | Wt 180.2 lb

## 2019-08-06 DIAGNOSIS — R7401 Elevation of levels of liver transaminase levels: Secondary | ICD-10-CM | POA: Diagnosis not present

## 2019-08-06 DIAGNOSIS — E559 Vitamin D deficiency, unspecified: Secondary | ICD-10-CM | POA: Diagnosis not present

## 2019-08-06 DIAGNOSIS — E1165 Type 2 diabetes mellitus with hyperglycemia: Secondary | ICD-10-CM

## 2019-08-06 DIAGNOSIS — E785 Hyperlipidemia, unspecified: Secondary | ICD-10-CM

## 2019-08-06 LAB — COMPREHENSIVE METABOLIC PANEL
ALT: 24 U/L (ref 0–35)
AST: 19 U/L (ref 0–37)
Albumin: 4.3 g/dL (ref 3.5–5.2)
Alkaline Phosphatase: 69 U/L (ref 39–117)
BUN: 17 mg/dL (ref 6–23)
CO2: 28 mEq/L (ref 19–32)
Calcium: 9.4 mg/dL (ref 8.4–10.5)
Chloride: 102 mEq/L (ref 96–112)
Creatinine, Ser: 0.7 mg/dL (ref 0.40–1.20)
GFR: 91.7 mL/min (ref >60.00)
Glucose, Bld: 127 mg/dL — ABNORMAL HIGH (ref 70–99)
Potassium: 4.1 mEq/L (ref 3.5–5.1)
Sodium: 136 mEq/L (ref 135–145)
Total Bilirubin: 0.7 mg/dL (ref 0.2–1.2)
Total Protein: 7 g/dL (ref 6.0–8.3)

## 2019-08-06 LAB — POCT GLYCOSYLATED HEMOGLOBIN (HGB A1C): Hemoglobin A1C: 6.5 % — AB (ref 4.0–5.6)

## 2019-08-06 LAB — LIPID PANEL
Cholesterol: 157 mg/dL (ref 0–200)
HDL: 39.3 mg/dL (ref >39.00)
NonHDL: 117.45
Total CHOL/HDL Ratio: 4
Triglycerides: 222 mg/dL — ABNORMAL HIGH (ref 0.0–149.0)
VLDL: 44.4 mg/dL — ABNORMAL HIGH (ref 0.0–40.0)

## 2019-08-06 LAB — MICROALBUMIN / CREATININE URINE RATIO
Creatinine,U: 135.8 mg/dL
Microalb Creat Ratio: 0.9 mg/g (ref 0.0–30.0)
Microalb, Ur: 1.2 mg/dL (ref 0.0–1.9)

## 2019-08-06 LAB — VITAMIN D 25 HYDROXY (VIT D DEFICIENCY, FRACTURES): VITD: 36.2 ng/mL (ref 30.00–100.00)

## 2019-08-06 LAB — LDL CHOLESTEROL, DIRECT: Direct LDL: 79 mg/dL

## 2019-08-06 NOTE — Progress Notes (Signed)
Established Patient Office Visit     This visit occurred during the SARS-CoV-2 public health emergency.  Safety protocols were in place, including screening questions prior to the visit, additional usage of staff PPE, and extensive cleaning of exam room while observing appropriate contact time as indicated for disinfecting solutions.    CC/Reason for Visit: 31-month follow-up chronic medical conditions  HPI: Jasmine Mueller is a 44 y.o. female who is coming in today for the above mentioned reasons. Past Medical History is significant for: During her last visit for her physical in December she was diagnosed with diabetes with an A1c of 7.1, vitamin D deficiency, hyperlipidemia and transaminitis.  She was started on Metformin 500 mg twice daily and atorvastatin 20 mg daily and has been tolerating these well.  She is here today for follow-up.  She has lost 6 pounds since we last spoke.  She has received both of her Covid vaccinations.  She has no complaints today.   Past Medical/Surgical History: Past Medical History:  Diagnosis Date  . Allergy   . Depression   . History of chicken pox   . Migraines     Past Surgical History:  Procedure Laterality Date  . WISDOM TOOTH EXTRACTION      Social History:  reports that she has never smoked. She has never used smokeless tobacco. She reports current alcohol use. She reports that she does not use drugs.  Allergies: No Known Allergies  Family History:  Family History  Problem Relation Age of Onset  . Migraines Mother   . Hypothyroidism Mother   . Glaucoma Father   . Hypertension Maternal Grandmother   . Hyperlipidemia Maternal Grandmother      Current Outpatient Medications:  .  atorvastatin (LIPITOR) 20 MG tablet, Take 1 tablet (20 mg total) by mouth daily., Disp: 90 tablet, Rfl: 1 .  metFORMIN (GLUCOPHAGE) 500 MG tablet, Take 1 tablet (500 mg total) by mouth 2 (two) times daily with a meal., Disp: 180 tablet, Rfl: 1 .   Vitamin D, Ergocalciferol, (DRISDOL) 1.25 MG (50000 UT) CAPS capsule, Take 1 capsule (50,000 Units total) by mouth every 7 (seven) days., Disp: 12 capsule, Rfl: 0  Review of Systems:  Constitutional: Denies fever, chills, diaphoresis, appetite change and fatigue.  HEENT: Denies photophobia, eye pain, redness, hearing loss, ear pain, congestion, sore throat, rhinorrhea, sneezing, mouth sores, trouble swallowing, neck pain, neck stiffness and tinnitus.   Respiratory: Denies SOB, DOE, cough, chest tightness,  and wheezing.   Cardiovascular: Denies chest pain, palpitations and leg swelling.  Gastrointestinal: Denies nausea, vomiting, abdominal pain, diarrhea, constipation, blood in stool and abdominal distention.  Genitourinary: Denies dysuria, urgency, frequency, hematuria, flank pain and difficulty urinating.  Endocrine: Denies: hot or cold intolerance, sweats, changes in hair or nails, polyuria, polydipsia. Musculoskeletal: Denies myalgias, back pain, joint swelling, arthralgias and gait problem.  Skin: Denies pallor, rash and wound.  Neurological: Denies dizziness, seizures, syncope, weakness, light-headedness, numbness and headaches.  Hematological: Denies adenopathy. Easy bruising, personal or family bleeding history  Psychiatric/Behavioral: Denies suicidal ideation, mood changes, confusion, nervousness, sleep disturbance and agitation    Physical Exam: Vitals:   08/06/19 0747  BP: 120/70  Pulse: 72  Temp: 97.6 F (36.4 C)  TempSrc: Temporal  SpO2: 96%  Weight: 180 lb 3.2 oz (81.7 kg)    Body mass index is 29.09 kg/m.   Constitutional: NAD, calm, comfortable Eyes: PERRL, lids and conjunctivae normal ENMT: Mucous membranes are moist.  Respiratory: clear to auscultation  bilaterally, no wheezing, no crackles. Normal respiratory effort. No accessory muscle use.  Cardiovascular: Regular rate and rhythm, no murmurs / rubs / gallops. No extremity edema.  Neurologic: Grossly intact  and nonfocal  Psychiatric: Normal judgment and insight. Alert and oriented x 3. Normal mood.    Impression and Plan:  Uncontrolled type 2 diabetes mellitus with hyperglycemia (HCC) -A1c is at goal at 6.5 today. -Continue Metformin 500 mg twice daily and lifestyle modifications.  Vitamin D deficiency  - Plan: VITAMIN D 25 Hydroxy (Vit-D Deficiency, Fractures)  Transaminitis  - Plan: Comprehensive metabolic panel  Hyperlipidemia, unspecified hyperlipidemia type  - Plan: Lipid panel    Patient Instructions  -Nice seeing you today!!  -Lab work today; will notify you once results are available.  -Schedule follow up in 3 months.     Lelon Frohlich, MD [facility] at [facility]

## 2019-08-06 NOTE — Patient Instructions (Signed)
-  Nice seeing you today!!  -Lab work today; will notify you once results are available.  -Schedule follow up in 3 months. 

## 2019-08-06 NOTE — Addendum Note (Signed)
Addended by: Bonnye Fava on: 08/06/2019 08:05 AM   Modules accepted: Orders

## 2019-08-07 ENCOUNTER — Other Ambulatory Visit: Payer: Self-pay | Admitting: Internal Medicine

## 2019-08-07 DIAGNOSIS — E785 Hyperlipidemia, unspecified: Secondary | ICD-10-CM

## 2019-09-19 ENCOUNTER — Other Ambulatory Visit: Payer: Self-pay | Admitting: Internal Medicine

## 2019-09-19 DIAGNOSIS — E1169 Type 2 diabetes mellitus with other specified complication: Secondary | ICD-10-CM

## 2019-09-19 DIAGNOSIS — E785 Hyperlipidemia, unspecified: Secondary | ICD-10-CM

## 2019-09-19 DIAGNOSIS — E1165 Type 2 diabetes mellitus with hyperglycemia: Secondary | ICD-10-CM

## 2019-11-05 ENCOUNTER — Other Ambulatory Visit: Payer: Self-pay | Admitting: Internal Medicine

## 2019-11-06 ENCOUNTER — Other Ambulatory Visit: Payer: Self-pay | Admitting: Internal Medicine

## 2019-11-06 ENCOUNTER — Ambulatory Visit: Payer: BC Managed Care – PPO | Admitting: Internal Medicine

## 2019-11-06 DIAGNOSIS — Z1231 Encounter for screening mammogram for malignant neoplasm of breast: Secondary | ICD-10-CM

## 2019-11-07 MED ORDER — VITAMIN D (ERGOCALCIFEROL) 1.25 MG (50000 UNIT) PO CAPS: 50000.0000 [IU] | ORAL_CAPSULE | ORAL | 0 refills | Status: DC

## 2019-12-03 ENCOUNTER — Ambulatory Visit: Payer: BC Managed Care – PPO | Admitting: Internal Medicine

## 2019-12-09 ENCOUNTER — Ambulatory Visit: Payer: BC Managed Care – PPO | Admitting: Internal Medicine

## 2019-12-19 ENCOUNTER — Ambulatory Visit: Payer: BC Managed Care – PPO | Admitting: Internal Medicine

## 2019-12-23 ENCOUNTER — Other Ambulatory Visit: Payer: Self-pay | Admitting: Internal Medicine

## 2019-12-23 DIAGNOSIS — E1165 Type 2 diabetes mellitus with hyperglycemia: Secondary | ICD-10-CM

## 2019-12-30 ENCOUNTER — Encounter: Payer: Self-pay | Admitting: Internal Medicine

## 2019-12-30 ENCOUNTER — Other Ambulatory Visit: Payer: Self-pay

## 2019-12-30 ENCOUNTER — Ambulatory Visit: Payer: BC Managed Care – PPO | Admitting: Internal Medicine

## 2019-12-30 VITALS — BP 120/80 | HR 86 | Temp 98.7°F | Wt 180.8 lb

## 2019-12-30 DIAGNOSIS — E1165 Type 2 diabetes mellitus with hyperglycemia: Secondary | ICD-10-CM

## 2019-12-30 DIAGNOSIS — E785 Hyperlipidemia, unspecified: Secondary | ICD-10-CM

## 2019-12-30 DIAGNOSIS — Z23 Encounter for immunization: Secondary | ICD-10-CM

## 2019-12-30 DIAGNOSIS — E559 Vitamin D deficiency, unspecified: Secondary | ICD-10-CM | POA: Diagnosis not present

## 2019-12-30 DIAGNOSIS — R7401 Elevation of levels of liver transaminase levels: Secondary | ICD-10-CM

## 2019-12-30 LAB — POCT GLYCOSYLATED HEMOGLOBIN (HGB A1C): Hemoglobin A1C: 6.3 % — AB (ref 4.0–5.6)

## 2019-12-30 NOTE — Patient Instructions (Signed)
-  Nice seeing you today!!  -Flu vaccine today.  -COVID booster at the pharmacy.  -See you back in 3 months.

## 2019-12-30 NOTE — Addendum Note (Signed)
Addended by: Kern Reap B on: 12/30/2019 04:56 PM   Modules accepted: Orders

## 2019-12-30 NOTE — Progress Notes (Signed)
Established Patient Office Visit     This visit occurred during the SARS-CoV-2 public health emergency.  Safety protocols were in place, including screening questions prior to the visit, additional usage of staff PPE, and extensive cleaning of exam room while observing appropriate contact time as indicated for disinfecting solutions.    CC/Reason for Visit: Follow-up chronic medical conditions  HPI: Jasmine Mueller is a 44 y.o. female who is coming in today for the above mentioned reasons. Past Medical History is significant for: Type 2 diabetes, vitamin D deficiency, hyperlipidemia and transaminitis.  She has no acute complaints today.  She has been tolerating atorvastatin and Metformin.  She is requesting flu vaccine, she has not yet had her mammogram.   Past Medical/Surgical History: Past Medical History:  Diagnosis Date  . Allergy   . Depression   . History of chicken pox   . Migraines     Past Surgical History:  Procedure Laterality Date  . WISDOM TOOTH EXTRACTION      Social History:  reports that she has never smoked. She has never used smokeless tobacco. She reports current alcohol use. She reports that she does not use drugs.  Allergies: No Known Allergies  Family History:  Family History  Problem Relation Age of Onset  . Migraines Mother   . Hypothyroidism Mother   . Glaucoma Father   . Hypertension Maternal Grandmother   . Hyperlipidemia Maternal Grandmother      Current Outpatient Medications:  .  atorvastatin (LIPITOR) 20 MG tablet, TAKE 1 TABLET(20 MG) BY MOUTH DAILY, Disp: 90 tablet, Rfl: 1 .  metFORMIN (GLUCOPHAGE) 500 MG tablet, TAKE 1 TABLET(500 MG) BY MOUTH TWICE DAILY WITH A MEAL, Disp: 180 tablet, Rfl: 0 .  Vitamin D, Ergocalciferol, (DRISDOL) 1.25 MG (50000 UNIT) CAPS capsule, Take 1 capsule (50,000 Units total) by mouth every 7 (seven) days., Disp: 12 capsule, Rfl: 0  Review of Systems:  Constitutional: Denies fever, chills, diaphoresis,  appetite change and fatigue.  HEENT: Denies photophobia, eye pain, redness, hearing loss, ear pain, congestion, sore throat, rhinorrhea, sneezing, mouth sores, trouble swallowing, neck pain, neck stiffness and tinnitus.   Respiratory: Denies SOB, DOE, cough, chest tightness,  and wheezing.   Cardiovascular: Denies chest pain, palpitations and leg swelling.  Gastrointestinal: Denies nausea, vomiting, abdominal pain, diarrhea, constipation, blood in stool and abdominal distention.  Genitourinary: Denies dysuria, urgency, frequency, hematuria, flank pain and difficulty urinating.  Endocrine: Denies: hot or cold intolerance, sweats, changes in hair or nails, polyuria, polydipsia. Musculoskeletal: Denies myalgias, back pain, joint swelling, arthralgias and gait problem.  Skin: Denies pallor, rash and wound.  Neurological: Denies dizziness, seizures, syncope, weakness, light-headedness, numbness and headaches.  Hematological: Denies adenopathy. Easy bruising, personal or family bleeding history  Psychiatric/Behavioral: Denies suicidal ideation, mood changes, confusion, nervousness, sleep disturbance and agitation    Physical Exam: Vitals:   12/30/19 0841  BP: 120/80  Pulse: 86  Temp: 98.7 F (37.1 C)  TempSrc: Oral  SpO2: 96%  Weight: 180 lb 12.8 oz (82 kg)    Body mass index is 29.18 kg/m.   Constitutional: NAD, calm, comfortable Eyes: PERRL, lids and conjunctivae normal ENMT: Mucous membranes are moist.  Respiratory: clear to auscultation bilaterally, no wheezing, no crackles. Normal respiratory effort. No accessory muscle use.  Cardiovascular: Regular rate and rhythm, no murmurs / rubs / gallops. No extremity edema.  Neurologic: Grossly intact and nonfocal Psychiatric: Normal judgment and insight. Alert and oriented x 3. Normal mood.  Impression and Plan:  Uncontrolled type 2 diabetes mellitus with hyperglycemia (HCC)  -A1c is well controlled at 6.3 today, continue Metformin  500 mg twice daily.  Hyperlipidemia, unspecified hyperlipidemia type -Last LDL was 79 in May, she is on atorvastatin 20 mg, recheck lipids at next visit.  Vitamin D deficiency -Recheck next visit  Transaminitis -Likely due to [place] given diabetes and hyperlipidemia, recheck labs when she returns for her next visit.  Need for influenza vaccination -Flu vaccine administered today.    Patient Instructions  -Nice seeing you today!!  -Flu vaccine today.  -COVID booster at the pharmacy.  -See you back in 3 months.     Chaya Jan, MD [facility] at [facility]

## 2020-02-09 ENCOUNTER — Other Ambulatory Visit: Payer: Self-pay

## 2020-04-27 ENCOUNTER — Other Ambulatory Visit: Payer: Self-pay | Admitting: Internal Medicine

## 2020-04-27 DIAGNOSIS — E1165 Type 2 diabetes mellitus with hyperglycemia: Secondary | ICD-10-CM

## 2020-05-31 ENCOUNTER — Ambulatory Visit: Payer: 59 | Admitting: Nurse Practitioner

## 2020-06-03 ENCOUNTER — Encounter: Payer: Self-pay | Admitting: Nurse Practitioner

## 2020-06-03 ENCOUNTER — Ambulatory Visit (INDEPENDENT_AMBULATORY_CARE_PROVIDER_SITE_OTHER): Payer: 59 | Admitting: Nurse Practitioner

## 2020-06-03 ENCOUNTER — Other Ambulatory Visit: Payer: Self-pay

## 2020-06-03 VITALS — BP 116/64 | HR 90 | Resp 16 | Ht 65.5 in | Wt 176.0 lb

## 2020-06-03 DIAGNOSIS — Z01419 Encounter for gynecological examination (general) (routine) without abnormal findings: Secondary | ICD-10-CM

## 2020-06-03 NOTE — Patient Instructions (Signed)
Health Maintenance, Female Adopting a healthy lifestyle and getting preventive care are important in promoting health and wellness. Ask your health care provider about:  The right schedule for you to have regular tests and exams.  Things you can do on your own to prevent diseases and keep yourself healthy. What should I know about diet, weight, and exercise? Eat a healthy diet  Eat a diet that includes plenty of vegetables, fruits, low-fat dairy products, and lean protein.  Do not eat a lot of foods that are high in solid fats, added sugars, or sodium.   Maintain a healthy weight Body mass index (BMI) is used to identify weight problems. It estimates body fat based on height and weight. Your health care provider can help determine your BMI and help you achieve or maintain a healthy weight. Get regular exercise Get regular exercise. This is one of the most important things you can do for your health. Most adults should:  Exercise for at least 150 minutes each week. The exercise should increase your heart rate and make you sweat (moderate-intensity exercise).  Do strengthening exercises at least twice a week. This is in addition to the moderate-intensity exercise.  Spend less time sitting. Even light physical activity can be beneficial. Watch cholesterol and blood lipids Have your blood tested for lipids and cholesterol at 43 years of age, then have this test every 5 years. Have your cholesterol levels checked more often if:  Your lipid or cholesterol levels are high.  You are older than 43 years of age.  You are at high risk for heart disease. What should I know about cancer screening? Depending on your health history and family history, you may need to have cancer screening at various ages. This may include screening for:  Breast cancer.  Cervical cancer.  Colorectal cancer.  Skin cancer.  Lung cancer. What should I know about heart disease, diabetes, and high blood  pressure? Blood pressure and heart disease  High blood pressure causes heart disease and increases the risk of stroke. This is more likely to develop in people who have high blood pressure readings, are of African descent, or are overweight.  Have your blood pressure checked: ? Every 3-5 years if you are 18-39 years of age. ? Every year if you are 40 years old or older. Diabetes Have regular diabetes screenings. This checks your fasting blood sugar level. Have the screening done:  Once every three years after age 40 if you are at a normal weight and have a low risk for diabetes.  More often and at a younger age if you are overweight or have a high risk for diabetes. What should I know about preventing infection? Hepatitis B If you have a higher risk for hepatitis B, you should be screened for this virus. Talk with your health care provider to find out if you are at risk for hepatitis B infection. Hepatitis C Testing is recommended for:  Everyone born from 1945 through 1965.  Anyone with known risk factors for hepatitis C. Sexually transmitted infections (STIs)  Get screened for STIs, including gonorrhea and chlamydia, if: ? You are sexually active and are younger than 43 years of age. ? You are older than 43 years of age and your health care provider tells you that you are at risk for this type of infection. ? Your sexual activity has changed since you were last screened, and you are at increased risk for chlamydia or gonorrhea. Ask your health care provider   if you are at risk.  Ask your health care provider about whether you are at high risk for HIV. Your health care provider may recommend a prescription medicine to help prevent HIV infection. If you choose to take medicine to prevent HIV, you should first get tested for HIV. You should then be tested every 3 months for as long as you are taking the medicine. Pregnancy  If you are about to stop having your period (premenopausal) and  you may become pregnant, seek counseling before you get pregnant.  Take 400 to 800 micrograms (mcg) of folic acid every day if you become pregnant.  Ask for birth control (contraception) if you want to prevent pregnancy. Osteoporosis and menopause Osteoporosis is a disease in which the bones lose minerals and strength with aging. This can result in bone fractures. If you are 65 years old or older, or if you are at risk for osteoporosis and fractures, ask your health care provider if you should:  Be screened for bone loss.  Take a calcium or vitamin D supplement to lower your risk of fractures.  Be given hormone replacement therapy (HRT) to treat symptoms of menopause. Follow these instructions at home: Lifestyle  Do not use any products that contain nicotine or tobacco, such as cigarettes, e-cigarettes, and chewing tobacco. If you need help quitting, ask your health care provider.  Do not use street drugs.  Do not share needles.  Ask your health care provider for help if you need support or information about quitting drugs. Alcohol use  Do not drink alcohol if: ? Your health care provider tells you not to drink. ? You are pregnant, may be pregnant, or are planning to become pregnant.  If you drink alcohol: ? Limit how much you use to 0-1 drink a day. ? Limit intake if you are breastfeeding.  Be aware of how much alcohol is in your drink. In the [place] one drink equals one 12 oz bottle of beer (355 mL), one 5 oz glass of wine (148 mL), or one 1 oz glass of hard liquor (44 mL). General instructions  Schedule regular health, dental, and eye exams.  Stay current with your vaccines.  Tell your health care provider if: ? You often feel depressed. ? You have ever been abused or do not feel safe at home. Summary  Adopting a healthy lifestyle and getting preventive care are important in promoting health and wellness.  Follow your health care provider's instructions about healthy  diet, exercising, and getting tested or screened for diseases.  Follow your health care provider's instructions on monitoring your cholesterol and blood pressure. This information is not intended to replace advice given to you by your health care provider. Make sure you discuss any questions you have with your health care provider. Document Revised: 03/13/2018 Document Reviewed: 03/13/2018 Elsevier Patient Education  2021 Elsevier Inc.  

## 2020-06-03 NOTE — Progress Notes (Signed)
   Jasmine Mueller 1975-10-31 [id number]   History:  45 y.o. G0 presents for annual exam without GYN complaints. Monthly cycle. Normal pap history. History of T2DM, infertility.   Gynecologic History Patient's last menstrual period was 05/30/2020 (exact date).   Contraception/Family planning: none  Health Maintenance Last Pap: 05/27/2019. Results were: normal Last mammogram: Never. Scheduled 07/10/2020 Last colonoscopy: N/A Last Dexa: N/A  Past medical history, past surgical history, family history and social history were all reviewed and documented in the EPIC chart.  ROS:  A ROS was performed and pertinent positives and negatives are included.  Exam:  Vitals:   06/03/20 1518  BP: 116/64  Pulse: 90  Resp: 16  Weight: 176 lb (79.8 kg)  Height: 5' 5.5" (1.664 m)   Body mass index is 28.84 kg/m.  General appearance:  Normal Thyroid:  Symmetrical, normal in size, without palpable masses or nodularity. Respiratory  Auscultation:  Clear without wheezing or rhonchi Cardiovascular  Auscultation:  Regular rate, without rubs, murmurs or gallops  Edema/varicosities:  Not grossly evident Abdominal  Soft,nontender, without masses, guarding or rebound.  Liver/spleen:  No organomegaly noted  Hernia:  None appreciated  Skin  Inspection:  Grossly normal   Breasts: Examined lying and sitting.   Right: Without masses, retractions, discharge or axillary adenopathy.   Left: Without masses, retractions, discharge or axillary adenopathy. Gentitourinary   Inguinal/mons:  Normal without inguinal adenopathy  External genitalia:  Normal  BUS/Urethra/Skene's glands:  Normal  Vagina:  Normal  Cervix:  Normal  Uterus:  Normal in size, shape and contour.  Midline and mobile  Adnexa/parametria:     Rt: Without masses or tenderness.   Lt: Without masses or tenderness.  Anus and perineum: Normal  Assessment/Plan:  45 y.o. G0 for annual exam.   Well female exam with routine gynecological  exam - Education provided on SBEs, importance of preventative screenings, current guidelines, high calcium diet, regular exercise, and multivitamin daily. Labs with PCP.   Screening for cervical cancer - Normal Pap history.  Will repeat at 5-year interval per guidelines.  Screening for breast cancer - Has not had screening mammogram, she is scheduled 07/10/2020.  Normal breast exam today.  Screening for colon cancer - She will start screenings at age 23.   Follow up in 1 year for annual.     Olivia Mackie [facility], 3:48 PM 06/03/2020

## 2020-07-20 ENCOUNTER — Ambulatory Visit
Admission: RE | Admit: 2020-07-20 | Discharge: 2020-07-20 | Disposition: A | Discharge status: Home / Self Care | Payer: 59 | Source: Ambulatory Visit | Attending: Internal Medicine | Admitting: Internal Medicine

## 2020-07-20 ENCOUNTER — Other Ambulatory Visit: Payer: Self-pay

## 2020-07-20 DIAGNOSIS — Z1231 Encounter for screening mammogram for malignant neoplasm of breast: Secondary | ICD-10-CM

## 2020-07-21 ENCOUNTER — Ambulatory Visit (INDEPENDENT_AMBULATORY_CARE_PROVIDER_SITE_OTHER): Payer: 59 | Admitting: Internal Medicine

## 2020-07-21 ENCOUNTER — Other Ambulatory Visit: Payer: Self-pay | Admitting: Internal Medicine

## 2020-07-21 ENCOUNTER — Encounter: Payer: Self-pay | Admitting: Internal Medicine

## 2020-07-21 VITALS — BP 120/84 | HR 76 | Temp 98.4°F | Wt 178.3 lb

## 2020-07-21 DIAGNOSIS — E785 Hyperlipidemia, unspecified: Secondary | ICD-10-CM | POA: Diagnosis not present

## 2020-07-21 DIAGNOSIS — E1169 Type 2 diabetes mellitus with other specified complication: Secondary | ICD-10-CM

## 2020-07-21 DIAGNOSIS — E559 Vitamin D deficiency, unspecified: Secondary | ICD-10-CM

## 2020-07-21 DIAGNOSIS — E1165 Type 2 diabetes mellitus with hyperglycemia: Secondary | ICD-10-CM | POA: Diagnosis not present

## 2020-07-21 DIAGNOSIS — R7401 Elevation of levels of liver transaminase levels: Secondary | ICD-10-CM

## 2020-07-21 LAB — POCT GLYCOSYLATED HEMOGLOBIN (HGB A1C): Hemoglobin A1C: 6.6 % — AB (ref 4.0–5.6)

## 2020-07-21 LAB — COMPREHENSIVE METABOLIC PANEL
ALT: 24 U/L (ref 0–35)
AST: 16 U/L (ref 0–37)
Albumin: 4.1 g/dL (ref 3.5–5.2)
Alkaline Phosphatase: 62 U/L (ref 39–117)
BUN: 13 mg/dL (ref 6–23)
CO2: 24 mEq/L (ref 19–32)
Calcium: 9.5 mg/dL (ref 8.4–10.5)
Chloride: 102 mEq/L (ref 96–112)
Creatinine, Ser: 0.76 mg/dL (ref 0.40–1.20)
GFR: 96.18 mL/min (ref >60.00)
Glucose, Bld: 130 mg/dL — ABNORMAL HIGH (ref 70–99)
Potassium: 4.3 mEq/L (ref 3.5–5.1)
Sodium: 136 mEq/L (ref 135–145)
Total Bilirubin: 1.1 mg/dL (ref 0.2–1.2)
Total Protein: 7.1 g/dL (ref 6.0–8.3)

## 2020-07-21 LAB — LIPID PANEL
Cholesterol: 171 mg/dL (ref 0–200)
HDL: 37.7 mg/dL — ABNORMAL LOW (ref >39.00)
LDL Cholesterol: 100 mg/dL — ABNORMAL HIGH (ref 0–99)
NonHDL: 133.67
Total CHOL/HDL Ratio: 5
Triglycerides: 166 mg/dL — ABNORMAL HIGH (ref 0.0–149.0)
VLDL: 33.2 mg/dL (ref 0.0–40.0)

## 2020-07-21 LAB — VITAMIN D 25 HYDROXY (VIT D DEFICIENCY, FRACTURES): VITD: 42.46 ng/mL (ref 30.00–100.00)

## 2020-07-21 MED ORDER — ATORVASTATIN CALCIUM 40 MG PO TABS: 40.0000 mg | ORAL_TABLET | Freq: Every day | ORAL | 1 refills | Status: AC

## 2020-07-21 NOTE — Patient Instructions (Signed)
-  Nice seeing you today!!  -Lab work today; will notify you once results are available.  -Schedule follow up in 3 months. 

## 2020-07-21 NOTE — Progress Notes (Signed)
Established Patient Office Visit     This visit occurred during the SARS-CoV-2 public health emergency.  Safety protocols were in place, including screening questions prior to the visit, additional usage of staff PPE, and extensive cleaning of exam room while observing appropriate contact time as indicated for disinfecting solutions.    CC/Reason for Visit: Follow-up chronic medical conditions  HPI: Jasmine Mueller is a 45 y.o. female who is coming in today for the above mentioned reasons. Past Medical History is significant for: Type 2 diabetes, vitamin D deficiency, hyperlipidemia and transaminitis.  She has no acute complaints today and has been doing well since we last spoke.  She tells me she will be going on a cruise in June.  She has been taking her medications as prescribed.   Past Medical/Surgical History: Past Medical History:  Diagnosis Date  . Allergy   . Depression   . History of chicken pox   . Migraines     Past Surgical History:  Procedure Laterality Date  . WISDOM TOOTH EXTRACTION      Social History:  reports that she has never smoked. She has never used smokeless tobacco. She reports current alcohol use. She reports that she does not use drugs.  Allergies: No Known Allergies  Family History:  Family History  Problem Relation Age of Onset  . Migraines Mother   . Hypothyroidism Mother   . Glaucoma Father   . Hypertension Maternal Grandmother   . Hyperlipidemia Maternal Grandmother      Current Outpatient Medications:  .  atorvastatin (LIPITOR) 20 MG tablet, TAKE 1 TABLET(20 MG) BY MOUTH DAILY, Disp: 90 tablet, Rfl: 1 .  metFORMIN (GLUCOPHAGE) 500 MG tablet, TAKE 1 TABLET(500 MG) BY MOUTH TWICE DAILY WITH A MEAL, Disp: 180 tablet, Rfl: 0  Review of Systems:  Constitutional: Denies fever, chills, diaphoresis, appetite change and fatigue.  HEENT: Denies photophobia, eye pain, redness, hearing loss, ear pain, congestion, sore throat, rhinorrhea,  sneezing, mouth sores, trouble swallowing, neck pain, neck stiffness and tinnitus.   Respiratory: Denies SOB, DOE, cough, chest tightness,  and wheezing.   Cardiovascular: Denies chest pain, palpitations and leg swelling.  Gastrointestinal: Denies nausea, vomiting, abdominal pain, diarrhea, constipation, blood in stool and abdominal distention.  Genitourinary: Denies dysuria, urgency, frequency, hematuria, flank pain and difficulty urinating.  Endocrine: Denies: hot or cold intolerance, sweats, changes in hair or nails, polyuria, polydipsia. Musculoskeletal: Denies myalgias, back pain, joint swelling, arthralgias and gait problem.  Skin: Denies pallor, rash and wound.  Neurological: Denies dizziness, seizures, syncope, weakness, light-headedness, numbness and headaches.  Hematological: Denies adenopathy. Easy bruising, personal or family bleeding history  Psychiatric/Behavioral: Denies suicidal ideation, mood changes, confusion, nervousness, sleep disturbance and agitation    Physical Exam: Vitals:   07/21/20 0801  BP: 120/84  Pulse: 76  Temp: 98.4 F (36.9 C)  TempSrc: Oral  SpO2: 97%  Weight: 178 lb 4.8 oz (80.9 kg)    Body mass index is 29.22 kg/m.   Constitutional: NAD, calm, comfortable Eyes: PERRL, lids and conjunctivae normal ENMT: Mucous membranes are moist.  Respiratory: clear to auscultation bilaterally, no wheezing, no crackles. Normal respiratory effort. No accessory muscle use.  Cardiovascular: Regular rate and rhythm, no murmurs / rubs / gallops. No extremity edema.  Neurologic: Grossly intact and nonfocal Psychiatric: Normal judgment and insight. Alert and oriented x 3. Normal mood.    Impression and Plan:  Uncontrolled type 2 diabetes mellitus with hyperglycemia (HCC)  -Was well controlled with an A1c  of 6.6 today.  Continue lifestyle changes as she is.  Hyperlipidemia, unspecified hyperlipidemia type  - Plan: Lipid panel  Transaminitis  - Plan:  Comprehensive metabolic panel, thought due to fatty liver disease  Vitamin D deficiency  - Plan: VITAMIN D 25 Hydroxy (Vit-D Deficiency, Fractures)  Time spent: 31 minutes interviewing patient, examining patient and discussing plan of care.    Patient Instructions  -Nice seeing you today!!  -Lab work today; will notify you once results are available.  -Schedule follow up in 3 months.     Chaya Jan, MD [facility]

## 2020-09-02 ENCOUNTER — Other Ambulatory Visit: Payer: Self-pay | Admitting: Internal Medicine

## 2020-09-02 DIAGNOSIS — E1165 Type 2 diabetes mellitus with hyperglycemia: Secondary | ICD-10-CM

## 2020-09-02 MED ORDER — METFORMIN HCL 500 MG PO TABS: 500.0000 mg | ORAL_TABLET | Freq: Two times a day (BID) | ORAL | 1 refills | Status: DC

## 2020-10-22 LAB — HM DIABETES EYE EXAM

## 2020-10-28 ENCOUNTER — Encounter: Payer: Self-pay | Admitting: Internal Medicine

## 2021-04-06 ENCOUNTER — Other Ambulatory Visit: Payer: Self-pay | Admitting: Internal Medicine

## 2021-04-06 DIAGNOSIS — E1165 Type 2 diabetes mellitus with hyperglycemia: Secondary | ICD-10-CM

## 2021-07-09 ENCOUNTER — Other Ambulatory Visit: Payer: Self-pay | Admitting: Internal Medicine

## 2021-07-09 DIAGNOSIS — E1165 Type 2 diabetes mellitus with hyperglycemia: Secondary | ICD-10-CM

## 2021-07-11 ENCOUNTER — Other Ambulatory Visit: Payer: Self-pay | Admitting: Internal Medicine

## 2021-07-11 DIAGNOSIS — E1165 Type 2 diabetes mellitus with hyperglycemia: Secondary | ICD-10-CM

## 2021-08-13 ENCOUNTER — Other Ambulatory Visit: Payer: Self-pay | Admitting: Internal Medicine

## 2021-08-13 DIAGNOSIS — E1165 Type 2 diabetes mellitus with hyperglycemia: Secondary | ICD-10-CM

## 2021-08-15 ENCOUNTER — Other Ambulatory Visit: Payer: Self-pay | Admitting: Internal Medicine

## 2021-08-15 DIAGNOSIS — E1165 Type 2 diabetes mellitus with hyperglycemia: Secondary | ICD-10-CM

## 2021-09-07 ENCOUNTER — Other Ambulatory Visit: Payer: Self-pay | Admitting: Internal Medicine

## 2021-09-07 DIAGNOSIS — E1165 Type 2 diabetes mellitus with hyperglycemia: Secondary | ICD-10-CM

## 2021-09-09 IMAGING — US US ABDOMEN LIMITED
1 series · 14 of 25 positions shown · non-contrast
Comparison: None.

CLINICAL DATA: Elevated liver enzymes

EXAM:
ULTRASOUND ABDOMEN LIMITED RIGHT UPPER QUADRANT

[Series 1: us abdomen limited · 0.26mm/px · 14 of 29 slices shown]
[im 1/29]
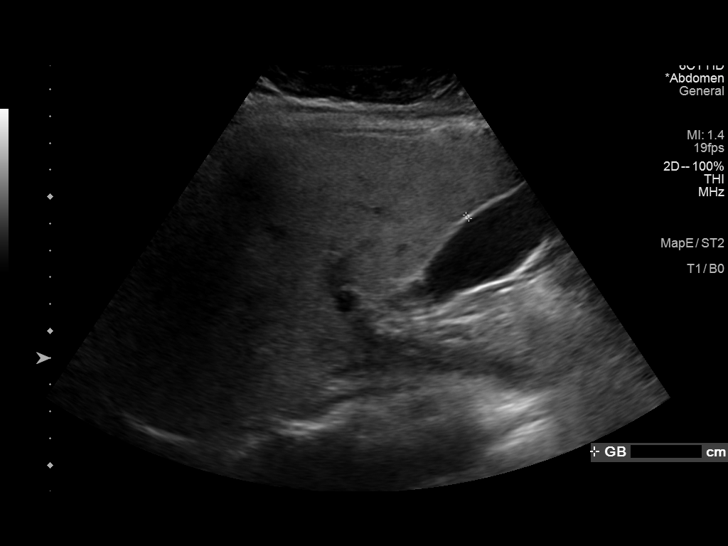
[im 3/29]
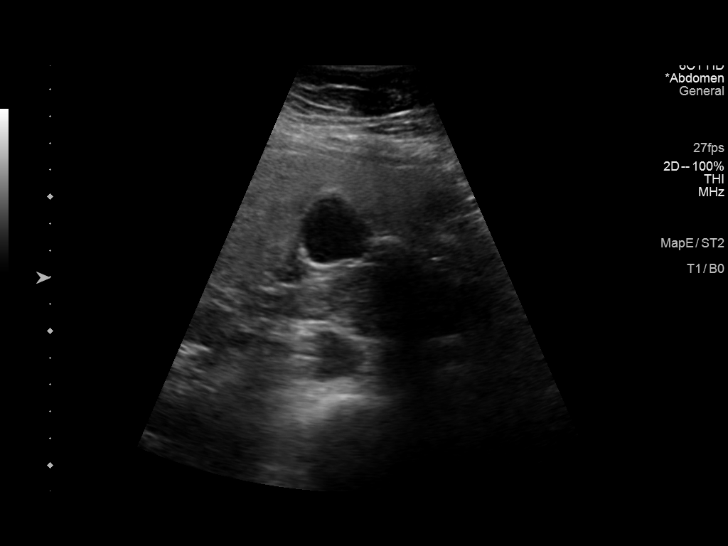
[im 5/29]
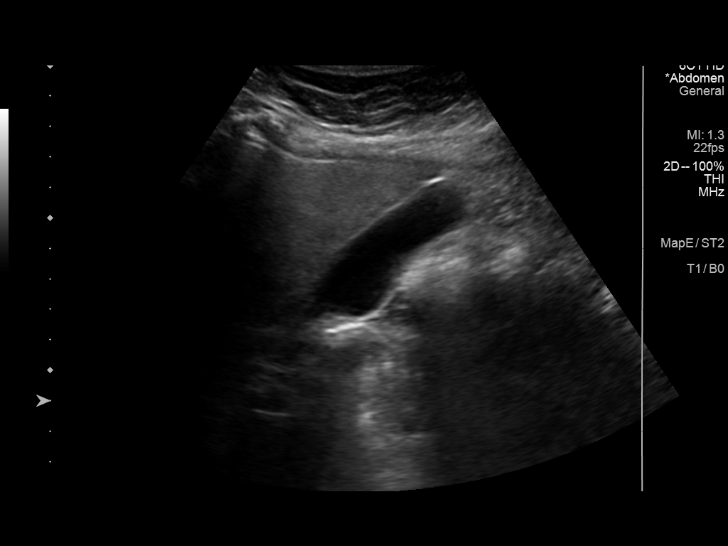
[im 8/29]
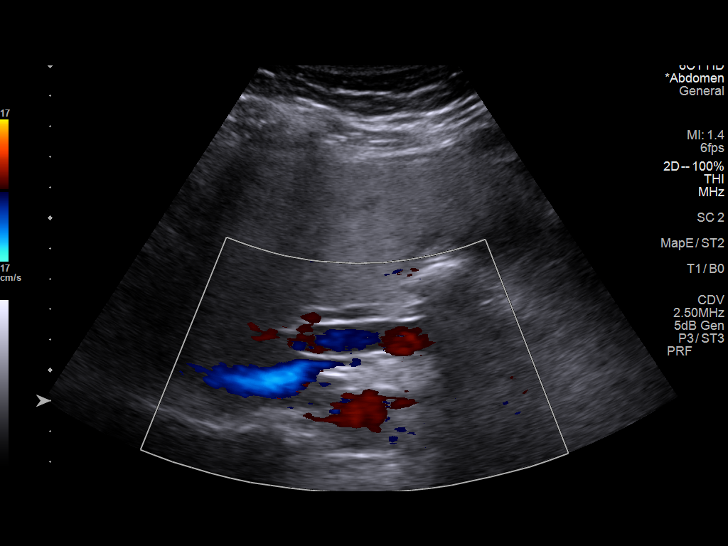
[im 10/29]
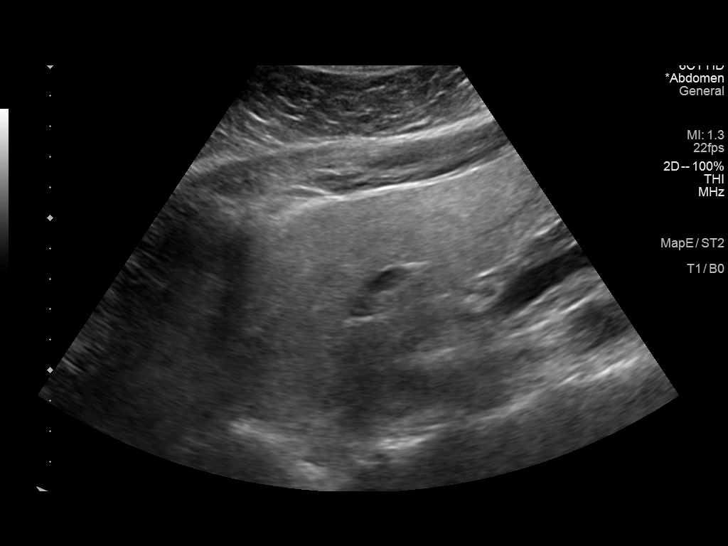
[im 11/29]
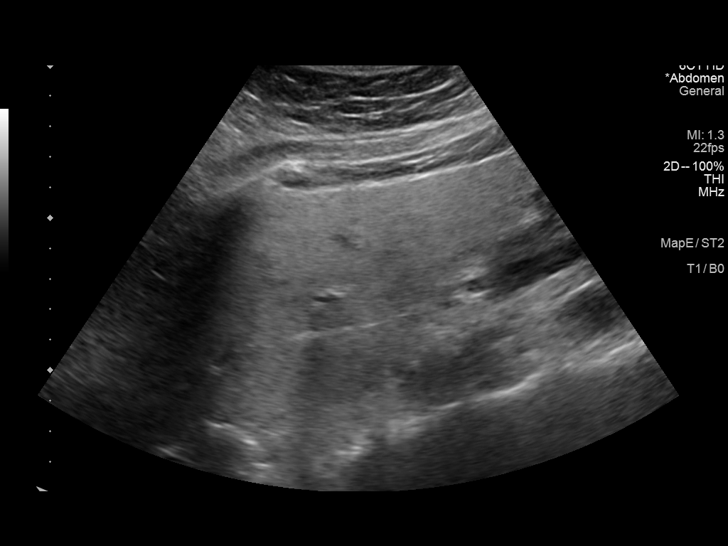
[im 13/29]
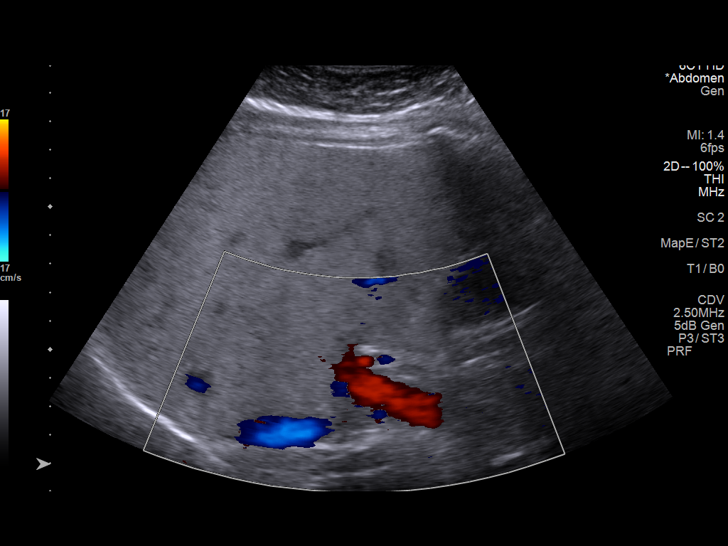
[im 16/29]
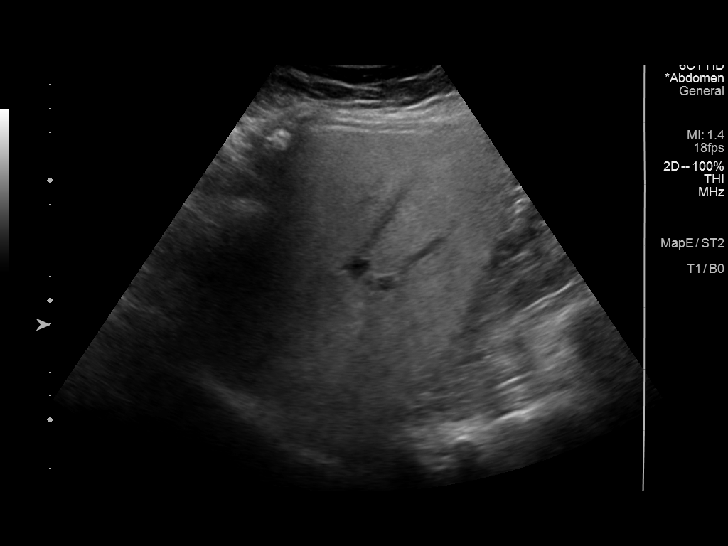
[im 18/29]
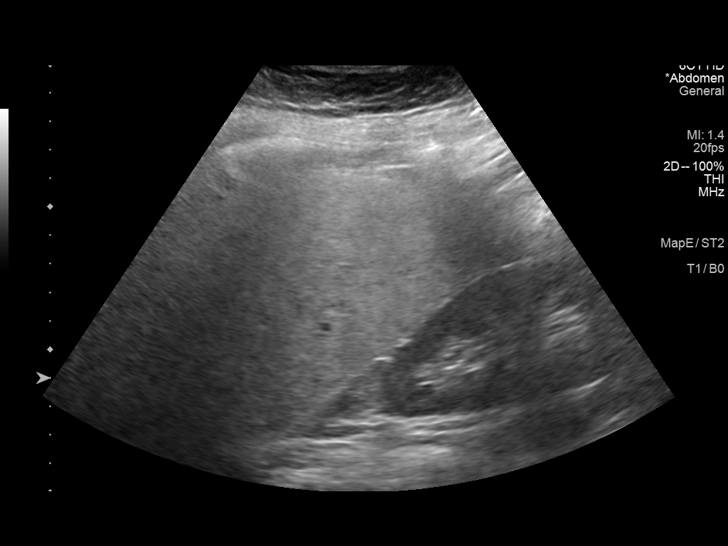
[im 19/29]
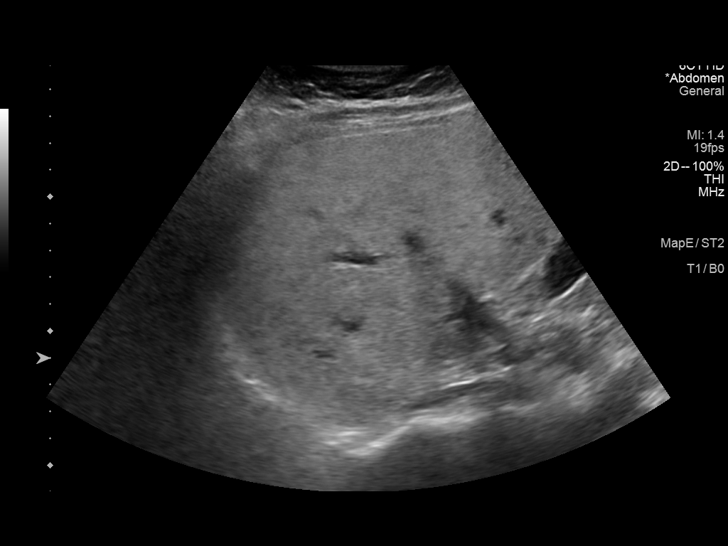
[im 22/29]
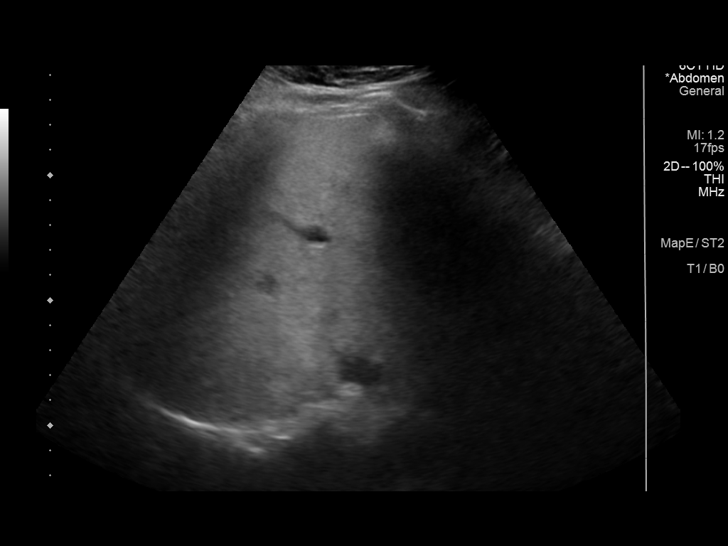
[im 24/29]
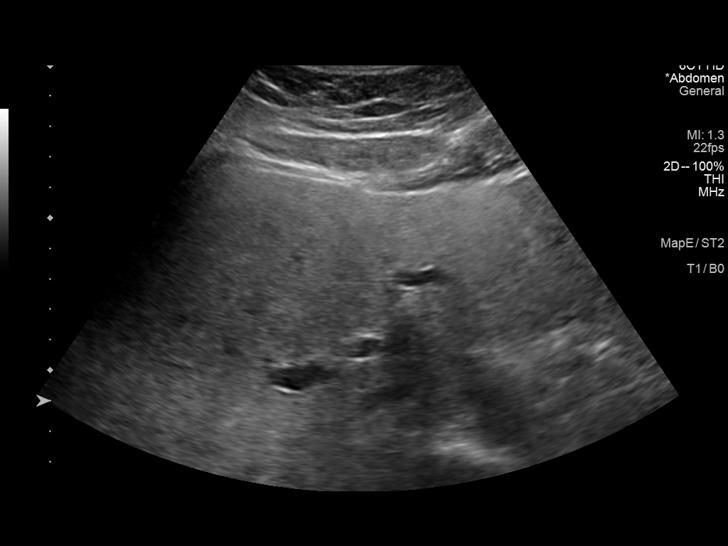
[im 26/29]
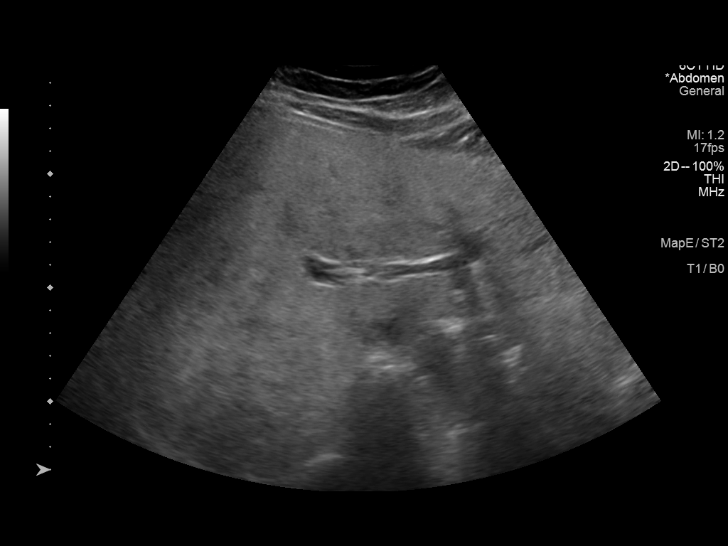
[im 29/29]
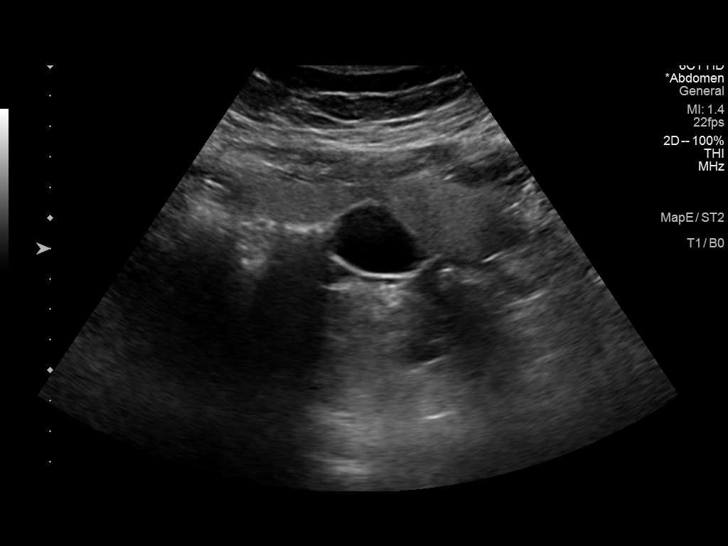

[14 of 25 positions shown; findings below may reference images not displayed]

FINDINGS: Gallbladder:

No gallstones or wall thickening visualized. There is no
pericholecystic fluid. No sonographic Murphy sign noted by
sonographer.

Common bile duct:

Diameter: 5 mm. No intrahepatic or extrahepatic biliary duct
dilatation.

Liver:

No focal lesion identified. Liver echogenicity overall is increased.
Portal vein is patent on color Doppler imaging with normal direction
of blood flow towards the liver.

Other: None.
IMPRESSION: 1. Liver echogenicity is increased, a finding indicative of hepatic
steatosis. No focal liver lesions are evident. It must be cautioned
that the sensitivity of ultrasound for detection of focal liver
lesions is diminished in this circumstance.

2.  Study otherwise unremarkable.

## 2021-11-08 ENCOUNTER — Other Ambulatory Visit: Payer: Self-pay | Admitting: Internal Medicine

## 2021-11-08 DIAGNOSIS — E1165 Type 2 diabetes mellitus with hyperglycemia: Secondary | ICD-10-CM

## 2021-11-10 ENCOUNTER — Telehealth: Payer: Self-pay | Admitting: Internal Medicine

## 2021-11-10 ENCOUNTER — Other Ambulatory Visit: Payer: Self-pay | Admitting: Internal Medicine

## 2021-11-10 DIAGNOSIS — E1165 Type 2 diabetes mellitus with hyperglycemia: Secondary | ICD-10-CM

## 2021-11-10 MED ORDER — METFORMIN HCL 500 MG PO TABS: ORAL_TABLET | ORAL | 0 refills | Status: DC

## 2021-11-10 NOTE — Telephone Encounter (Signed)
Pt has an appt on 11-17-2021 and would like a refill on metFORMIN (GLUCOPHAGE) 500 MG tablet  [id number] - [place], [place] - [address] AT [facility] [address] Phone:  [phone]  Fax:  [phone]

## 2021-11-10 NOTE — Telephone Encounter (Signed)
Refill sent.

## 2021-11-17 ENCOUNTER — Ambulatory Visit: Payer: 59 | Admitting: Internal Medicine

## 2022-01-06 ENCOUNTER — Other Ambulatory Visit: Payer: Self-pay | Admitting: Internal Medicine

## 2022-01-06 DIAGNOSIS — E1165 Type 2 diabetes mellitus with hyperglycemia: Secondary | ICD-10-CM

## 2022-01-10 ENCOUNTER — Other Ambulatory Visit: Payer: Self-pay | Admitting: Internal Medicine

## 2022-01-10 DIAGNOSIS — E1165 Type 2 diabetes mellitus with hyperglycemia: Secondary | ICD-10-CM

## 2022-01-10 MED ORDER — METFORMIN HCL 500 MG PO TABS: ORAL_TABLET | ORAL | 0 refills | Status: DC

## 2022-01-11 ENCOUNTER — Encounter: Payer: Self-pay | Admitting: Internal Medicine

## 2022-01-17 ENCOUNTER — Encounter: Payer: Self-pay | Admitting: Internal Medicine

## 2022-02-13 ENCOUNTER — Other Ambulatory Visit: Payer: Self-pay | Admitting: Internal Medicine

## 2022-02-13 DIAGNOSIS — E1165 Type 2 diabetes mellitus with hyperglycemia: Secondary | ICD-10-CM

## 2022-02-14 ENCOUNTER — Other Ambulatory Visit: Payer: Self-pay | Admitting: Internal Medicine

## 2022-02-14 DIAGNOSIS — E1165 Type 2 diabetes mellitus with hyperglycemia: Secondary | ICD-10-CM

## 2022-02-14 MED ORDER — METFORMIN HCL 500 MG PO TABS: ORAL_TABLET | ORAL | 0 refills | Status: AC
